# Patient Record
Sex: Female | Born: 1970 | Race: White | Hispanic: Yes | Marital: Married | Smoking: Never smoker
Health system: Southern US, Community
[De-identification: ages and names within clinical notes are randomized; demographics above are authoritative.]

## PROBLEM LIST (undated history)

## (undated) ENCOUNTER — Emergency Department (HOSPITAL_BASED_OUTPATIENT_CLINIC_OR_DEPARTMENT_OTHER): Admission: EM | Payer: Self-pay | Source: Home / Self Care

## (undated) DIAGNOSIS — I10 Essential (primary) hypertension: Secondary | ICD-10-CM

## (undated) DIAGNOSIS — F410 Panic disorder [episodic paroxysmal anxiety] without agoraphobia: Secondary | ICD-10-CM

## (undated) DIAGNOSIS — F32A Depression, unspecified: Secondary | ICD-10-CM

## (undated) DIAGNOSIS — F329 Major depressive disorder, single episode, unspecified: Secondary | ICD-10-CM

## (undated) DIAGNOSIS — E559 Vitamin D deficiency, unspecified: Secondary | ICD-10-CM

## (undated) DIAGNOSIS — G4730 Sleep apnea, unspecified: Secondary | ICD-10-CM

## (undated) HISTORY — DX: Panic disorder (episodic paroxysmal anxiety): F41.0

## (undated) HISTORY — PX: BREAST REDUCTION SURGERY: SHX8

## (undated) HISTORY — DX: Sleep apnea, unspecified: G47.30

## (undated) HISTORY — PX: ABLATION: SHX5711

## (undated) HISTORY — PX: LAPAROSCOPIC ABDOMINAL EXPLORATION: SHX6249

---

## 2002-10-27 ENCOUNTER — Emergency Department (HOSPITAL_COMMUNITY): Admission: EM | Admit: 2002-10-27 | Discharge: 2002-10-27 | Payer: Self-pay | Admitting: Emergency Medicine

## 2004-05-19 ENCOUNTER — Inpatient Hospital Stay (HOSPITAL_COMMUNITY): Admission: AD | Admit: 2004-05-19 | Discharge: 2004-05-19 | Payer: Self-pay | Admitting: Family Medicine

## 2006-03-25 ENCOUNTER — Emergency Department (HOSPITAL_COMMUNITY): Admission: EM | Admit: 2006-03-25 | Discharge: 2006-03-25 | Payer: Self-pay | Admitting: Emergency Medicine

## 2006-07-27 ENCOUNTER — Emergency Department (HOSPITAL_COMMUNITY): Admission: EM | Admit: 2006-07-27 | Discharge: 2006-07-27 | Payer: Self-pay | Admitting: Emergency Medicine

## 2006-07-30 ENCOUNTER — Emergency Department (HOSPITAL_COMMUNITY): Admission: EM | Admit: 2006-07-30 | Discharge: 2006-07-30 | Payer: Self-pay | Admitting: Emergency Medicine

## 2006-10-27 HISTORY — PX: ENDOMETRIAL ABLATION: SHX621

## 2006-12-16 ENCOUNTER — Emergency Department (HOSPITAL_COMMUNITY): Admission: EM | Admit: 2006-12-16 | Discharge: 2006-12-16 | Payer: Self-pay | Admitting: Emergency Medicine

## 2010-10-28 ENCOUNTER — Emergency Department (HOSPITAL_COMMUNITY)
Admission: EM | Admit: 2010-10-28 | Discharge: 2010-10-28 | Payer: Self-pay | Source: Home / Self Care | Admitting: Emergency Medicine

## 2011-01-06 LAB — DIFFERENTIAL
Basophils Absolute: 0.1 10*3/uL (ref 0.0–0.1)
Basophils Relative: 0 % (ref 0–1)
Eosinophils Absolute: 0.1 10*3/uL (ref 0.0–0.7)
Eosinophils Relative: 1 % (ref 0–5)
Lymphocytes Relative: 18 % (ref 12–46)
Monocytes Absolute: 0.6 10*3/uL (ref 0.1–1.0)
Neutrophils Relative %: 75 % (ref 43–77)

## 2011-01-06 LAB — CBC
MCH: 30.4 pg (ref 26.0–34.0)
Platelets: 297 10*3/uL (ref 150–400)
RBC: 4.47 MIL/uL (ref 3.87–5.11)
RDW: 13.2 % (ref 11.5–15.5)

## 2011-01-06 LAB — POCT CARDIAC MARKERS: Myoglobin, poc: 48.2 ng/mL (ref 12–200)

## 2011-01-06 LAB — BASIC METABOLIC PANEL
CO2: 25 mEq/L (ref 19–32)
Chloride: 107 mEq/L (ref 96–112)
Creatinine, Ser: 0.76 mg/dL (ref 0.4–1.2)
GFR calc Af Amer: >60 mL/min (ref >60)

## 2011-02-04 DIAGNOSIS — G9332 Myalgic encephalomyelitis/chronic fatigue syndrome: Secondary | ICD-10-CM

## 2011-02-04 DIAGNOSIS — R002 Palpitations: Secondary | ICD-10-CM | POA: Insufficient documentation

## 2011-02-04 DIAGNOSIS — N926 Irregular menstruation, unspecified: Secondary | ICD-10-CM | POA: Insufficient documentation

## 2011-02-04 DIAGNOSIS — H8110 Benign paroxysmal vertigo, unspecified ear: Secondary | ICD-10-CM | POA: Insufficient documentation

## 2011-02-04 DIAGNOSIS — D497 Neoplasm of unspecified behavior of endocrine glands and other parts of nervous system: Secondary | ICD-10-CM | POA: Insufficient documentation

## 2011-02-04 DIAGNOSIS — J309 Allergic rhinitis, unspecified: Secondary | ICD-10-CM | POA: Insufficient documentation

## 2011-02-04 DIAGNOSIS — G4700 Insomnia, unspecified: Secondary | ICD-10-CM

## 2011-02-04 DIAGNOSIS — R930 Abnormal findings on diagnostic imaging of skull and head, not elsewhere classified: Secondary | ICD-10-CM | POA: Insufficient documentation

## 2011-02-04 DIAGNOSIS — M542 Cervicalgia: Secondary | ICD-10-CM | POA: Insufficient documentation

## 2011-02-04 DIAGNOSIS — F410 Panic disorder [episodic paroxysmal anxiety] without agoraphobia: Secondary | ICD-10-CM | POA: Insufficient documentation

## 2011-02-04 DIAGNOSIS — M6790 Unspecified disorder of synovium and tendon, unspecified site: Secondary | ICD-10-CM

## 2011-02-04 DIAGNOSIS — E669 Obesity, unspecified: Secondary | ICD-10-CM | POA: Insufficient documentation

## 2011-02-04 HISTORY — DX: Palpitations: R00.2

## 2011-02-04 HISTORY — DX: Myalgic encephalomyelitis/chronic fatigue syndrome: G93.32

## 2011-02-04 HISTORY — DX: Insomnia, unspecified: G47.00

## 2011-02-04 HISTORY — DX: Unspecified disorder of synovium and tendon, unspecified site: M67.90

## 2011-04-02 DIAGNOSIS — D649 Anemia, unspecified: Secondary | ICD-10-CM | POA: Insufficient documentation

## 2011-04-02 DIAGNOSIS — E782 Mixed hyperlipidemia: Secondary | ICD-10-CM | POA: Insufficient documentation

## 2011-07-07 DIAGNOSIS — M25569 Pain in unspecified knee: Secondary | ICD-10-CM | POA: Insufficient documentation

## 2011-11-13 DIAGNOSIS — F411 Generalized anxiety disorder: Secondary | ICD-10-CM

## 2011-11-13 HISTORY — DX: Generalized anxiety disorder: F41.1

## 2012-01-22 DIAGNOSIS — Z01419 Encounter for gynecological examination (general) (routine) without abnormal findings: Secondary | ICD-10-CM | POA: Insufficient documentation

## 2012-01-22 DIAGNOSIS — M79609 Pain in unspecified limb: Secondary | ICD-10-CM | POA: Insufficient documentation

## 2013-11-30 DIAGNOSIS — J329 Chronic sinusitis, unspecified: Secondary | ICD-10-CM | POA: Insufficient documentation

## 2013-12-27 DIAGNOSIS — F4329 Adjustment disorder with other symptoms: Secondary | ICD-10-CM | POA: Insufficient documentation

## 2013-12-27 DIAGNOSIS — R5383 Other fatigue: Secondary | ICD-10-CM | POA: Insufficient documentation

## 2013-12-27 DIAGNOSIS — I10 Essential (primary) hypertension: Secondary | ICD-10-CM | POA: Insufficient documentation

## 2013-12-27 DIAGNOSIS — R5381 Other malaise: Secondary | ICD-10-CM | POA: Insufficient documentation

## 2015-04-27 ENCOUNTER — Emergency Department (HOSPITAL_BASED_OUTPATIENT_CLINIC_OR_DEPARTMENT_OTHER)
Admission: EM | Admit: 2015-04-27 | Discharge: 2015-04-27 | Disposition: A | Discharge status: Home / Self Care | Attending: Emergency Medicine | Admitting: Emergency Medicine

## 2015-04-27 ENCOUNTER — Emergency Department (HOSPITAL_BASED_OUTPATIENT_CLINIC_OR_DEPARTMENT_OTHER)

## 2015-04-27 ENCOUNTER — Encounter (HOSPITAL_BASED_OUTPATIENT_CLINIC_OR_DEPARTMENT_OTHER): Payer: Self-pay | Admitting: Emergency Medicine

## 2015-04-27 DIAGNOSIS — H05041 Tenonitis of right orbit: Secondary | ICD-10-CM

## 2015-04-27 DIAGNOSIS — F329 Major depressive disorder, single episode, unspecified: Secondary | ICD-10-CM | POA: Insufficient documentation

## 2015-04-27 DIAGNOSIS — M779 Enthesopathy, unspecified: Secondary | ICD-10-CM | POA: Diagnosis not present

## 2015-04-27 DIAGNOSIS — E559 Vitamin D deficiency, unspecified: Secondary | ICD-10-CM | POA: Insufficient documentation

## 2015-04-27 DIAGNOSIS — M25571 Pain in right ankle and joints of right foot: Secondary | ICD-10-CM | POA: Diagnosis present

## 2015-04-27 DIAGNOSIS — I10 Essential (primary) hypertension: Secondary | ICD-10-CM | POA: Diagnosis not present

## 2015-04-27 DIAGNOSIS — Z79899 Other long term (current) drug therapy: Secondary | ICD-10-CM | POA: Insufficient documentation

## 2015-04-27 HISTORY — DX: Depression, unspecified: F32.A

## 2015-04-27 HISTORY — DX: Vitamin D deficiency, unspecified: E55.9

## 2015-04-27 HISTORY — DX: Essential (primary) hypertension: I10

## 2015-04-27 HISTORY — DX: Major depressive disorder, single episode, unspecified: F32.9

## 2015-04-27 NOTE — Discharge Instructions (Signed)

## 2015-04-27 NOTE — ED Notes (Signed)
Pt states increased foot pain for one week. Denies injury. States she has been moving a lot of stuff but doesn't remember hurting it.

## 2015-04-27 NOTE — ED Provider Notes (Signed)
CSN: [id number]     Arrival date & time 04/27/15  1338 History   First MD Initiated Contact with Patient 04/27/15 1343     Chief Complaint  Patient presents with  . Foot Pain     (Consider location/radiation/quality/duration/timing/severity/associated sxs/prior Treatment) HPI Comments: Patient presents emergency department with chief complaint of right foot pain. She states that her right foot hurts on the top lateral aspect of the foot. She denies any known traumatic injury. She states that she has been going up and down a lot of stairs for the past week he can she has been helping her children move to and from college. She states that the pain is worsened with palpation. She reports a mild amount of swelling. She denies any recent long travel or immobilization. He tried taking ibuprofen with good relief. She states that she wanted to be evaluated because she is going out of town next week and would like some relief prior to leaving.  The history is provided by the patient. No language interpreter was used.    Past Medical History  Diagnosis Date  . Depression   . Hypertension   . Vitamin D deficiency    History reviewed. No pertinent past surgical history. No family history on file. History  Substance Use Topics  . Smoking status: Never Smoker   . Smokeless tobacco: Not on file  . Alcohol Use: Yes     Comment: socially   OB History    No data available     Review of Systems  Constitutional: Negative for fever and chills.  Respiratory: Negative for shortness of breath.   Cardiovascular: Negative for chest pain.  Gastrointestinal: Negative for nausea, vomiting, diarrhea and constipation.  Genitourinary: Negative for dysuria.  Musculoskeletal: Positive for myalgias and joint swelling.  All other systems reviewed and are negative.     Allergies  Review of patient's allergies indicates no known allergies.  Home Medications   Prior to Admission medications   Medication  Sig Start Date End Date Taking? Authorizing Provider  cholecalciferol (VITAMIN D) 1000 UNITS tablet Take 1,000 Units by mouth daily.   Yes Historical Provider, MD  escitalopram (LEXAPRO) 10 MG tablet Take 10 mg by mouth daily.   Yes Historical Provider, MD  metoprolol tartrate (LOPRESSOR) 25 MG tablet Take 25 mg by mouth 2 (two) times daily.   Yes Historical Provider, MD   BP 124/81 mmHg  Pulse 58  Temp(Src) 98.3 F (36.8 C) (Oral)  Ht 5' 8.5" (1.74 m)  Wt 255 lb (115.667 kg)  BMI 38.20 kg/m2  SpO2 98% Physical Exam  Constitutional: She is oriented to person, place, and time. She appears well-developed and well-nourished.  HENT:  Head: Normocephalic and atraumatic.  Eyes: Conjunctivae and EOM are normal.  Neck: Normal range of motion.  Cardiovascular: Normal rate and intact distal pulses.   Intact distal pulses with brisk capillary refill  Pulmonary/Chest: Effort normal.  Abdominal: She exhibits no distension.  Musculoskeletal: Normal range of motion.  Right foot tender to palpation over the lateral aspect, there is pain with inversion, and resisted dorsiflexion, no bony abnormality or deformity  Neurological: She is alert and oriented to person, place, and time.  Skin: Skin is dry.  Skin is warm and dry, and there is no erythema, cellulitis, or abscess  Psychiatric: She has a normal mood and affect. Her behavior is normal. Judgment and thought content normal.  Nursing note and vitals reviewed.   ED Course  Procedures (including critical care  time) Labs Review Labs Reviewed - No data to display  Imaging Review No results found.   EKG Interpretation None      MDM   Final diagnoses:  Tenonitis, right    Patient with right foot pain. I suspect that this is an extensor tendinitis, no bony abnormality or deformity. I suspect that injury is secondary to overuse and climbing a lot upstairs in the past week.  Will check plain films.  If negative, plan for conservative  therapy with ice and rest.  Follow-up with orthopedics if needed.    Montine Circle, PA-C 04/27/15 [place], MD 04/27/15 [phone]

## 2015-09-23 ENCOUNTER — Emergency Department (HOSPITAL_BASED_OUTPATIENT_CLINIC_OR_DEPARTMENT_OTHER)

## 2015-09-23 ENCOUNTER — Encounter (HOSPITAL_BASED_OUTPATIENT_CLINIC_OR_DEPARTMENT_OTHER): Payer: Self-pay | Admitting: *Deleted

## 2015-09-23 ENCOUNTER — Emergency Department (HOSPITAL_BASED_OUTPATIENT_CLINIC_OR_DEPARTMENT_OTHER)
Admission: EM | Admit: 2015-09-23 | Discharge: 2015-09-23 | Disposition: A | Discharge status: Home / Self Care | Attending: Emergency Medicine | Admitting: Emergency Medicine

## 2015-09-23 DIAGNOSIS — Z79899 Other long term (current) drug therapy: Secondary | ICD-10-CM | POA: Insufficient documentation

## 2015-09-23 DIAGNOSIS — F329 Major depressive disorder, single episode, unspecified: Secondary | ICD-10-CM | POA: Insufficient documentation

## 2015-09-23 DIAGNOSIS — I10 Essential (primary) hypertension: Secondary | ICD-10-CM | POA: Insufficient documentation

## 2015-09-23 DIAGNOSIS — R05 Cough: Secondary | ICD-10-CM | POA: Diagnosis present

## 2015-09-23 DIAGNOSIS — J40 Bronchitis, not specified as acute or chronic: Secondary | ICD-10-CM | POA: Insufficient documentation

## 2015-09-23 DIAGNOSIS — E559 Vitamin D deficiency, unspecified: Secondary | ICD-10-CM | POA: Insufficient documentation

## 2015-09-23 MED ORDER — HYDROCODONE-HOMATROPINE 5-1.5 MG/5ML PO SYRP: 5.0000 mL | ORAL_SOLUTION | Freq: Four times a day (QID) | ORAL | Status: DC | PRN

## 2015-09-23 MED ORDER — ALBUTEROL SULFATE HFA 108 (90 BASE) MCG/ACT IN AERS
2.0000 | INHALATION_SPRAY | RESPIRATORY_TRACT | Status: DC | PRN
  Administered 2015-09-23: 2 via RESPIRATORY_TRACT
  Filled 2015-09-23: qty 6.7

## 2015-09-23 MED ORDER — PREDNISONE 50 MG PO TABS
60.0000 mg | ORAL_TABLET | Freq: Once | ORAL | Status: AC
  Administered 2015-09-23: 60 mg via ORAL
  Filled 2015-09-23: qty 1

## 2015-09-23 MED ORDER — IPRATROPIUM-ALBUTEROL 0.5-2.5 (3) MG/3ML IN SOLN
3.0000 mL | Freq: Once | RESPIRATORY_TRACT | Status: AC
  Administered 2015-09-23: 3 mL via RESPIRATORY_TRACT
  Filled 2015-09-23: qty 3

## 2015-09-23 NOTE — Discharge Instructions (Signed)
Upper Respiratory Infection, Adult Most upper respiratory infections (URIs) are a viral infection of the air passages leading to the lungs. A URI affects the nose, throat, and upper air passages. The most common type of URI is nasopharyngitis and is typically referred to as "the common cold." URIs run their course and usually go away on their own. Most of the time, a URI does not require medical attention, but sometimes a bacterial infection in the upper airways can follow a viral infection. This is called a secondary infection. Sinus and middle ear infections are common types of secondary upper respiratory infections. Bacterial pneumonia can also complicate a URI. A URI can worsen asthma and chronic obstructive pulmonary disease (COPD). Sometimes, these complications can require emergency medical care and may be life threatening.  CAUSES Almost all URIs are caused by viruses. A virus is a type of germ and can spread from one person to another.  RISKS FACTORS You may be at risk for a URI if:   You smoke.   You have chronic heart or lung disease.  You have a weakened defense (immune) system.   You are very young or very old.   You have nasal allergies or asthma.  You work in crowded or poorly ventilated areas.  You work in health care facilities or schools. SIGNS AND SYMPTOMS  Symptoms typically develop 2-3 days after you come in contact with a cold virus. Most viral URIs last 7-10 days. However, viral URIs from the influenza virus (flu virus) can last 14-18 days and are typically more severe. Symptoms may include:   Runny or stuffy (congested) nose.   Sneezing.   Cough.   Sore throat.   Headache.   Fatigue.   Fever.   Loss of appetite.   Pain in your forehead, behind your eyes, and over your cheekbones (sinus pain).  Muscle aches.  DIAGNOSIS  Your health care provider may diagnose a URI by:  Physical exam.  Tests to check that your symptoms are not due to  another condition such as:  Strep throat.  Sinusitis.  Pneumonia.  Asthma. TREATMENT  A URI goes away on its own with time. It cannot be cured with medicines, but medicines may be prescribed or recommended to relieve symptoms. Medicines may help:  Reduce your fever.  Reduce your cough.  Relieve nasal congestion. HOME CARE INSTRUCTIONS   Take medicines only as directed by your health care provider.   Gargle warm saltwater or take cough drops to comfort your throat as directed by your health care provider.  Use a warm mist humidifier or inhale steam from a shower to increase air moisture. This may make it easier to breathe.  Drink enough fluid to keep your urine clear or pale yellow.   Eat soups and other clear broths and maintain good nutrition.   Rest as needed.   Return to work when your temperature has returned to normal or as your health care provider advises. You may need to stay home longer to avoid infecting others. You can also use a face mask and careful hand washing to prevent spread of the virus.  Increase the usage of your inhaler if you have asthma.   Do not use any tobacco products, including cigarettes, chewing tobacco, or electronic cigarettes. If you need help quitting, ask your health care provider. PREVENTION  The best way to protect yourself from getting a cold is to practice good hygiene.   Avoid oral or hand contact with people with cold   symptoms.   Wash your hands often if contact occurs.  There is no clear evidence that vitamin C, vitamin E, echinacea, or exercise reduces the chance of developing a cold. However, it is always recommended to get plenty of rest, exercise, and practice good nutrition.  SEEK MEDICAL CARE IF:   You are getting worse rather than better.   Your symptoms are not controlled by medicine.   You have chills.  You have worsening shortness of breath.  You have brown or red mucus.  You have yellow or brown nasal  discharge.  You have pain in your face, especially when you bend forward.  You have a fever.  You have swollen neck glands.  You have pain while swallowing.  You have white areas in the back of your throat. SEEK IMMEDIATE MEDICAL CARE IF:   You have severe or persistent:  Headache.  Ear pain.  Sinus pain.  Chest pain.  You have chronic lung disease and any of the following:  Wheezing.  Prolonged cough.  Coughing up blood.  A change in your usual mucus.  You have a stiff neck.  You have changes in your:  Vision.  Hearing.  Thinking.  Mood. MAKE SURE YOU:   Understand these instructions.  Will watch your condition.  Will get help right away if you are not doing well or get worse.   This information is not intended to replace advice given to you by your health care provider. Make sure you discuss any questions you have with your health care provider.   Document Released: 04/08/2001 Document Revised: 02/27/2015 Document Reviewed: 01/18/2014 Elsevier Interactive Patient Education 2016 [employer]  

## 2015-09-23 NOTE — ED Provider Notes (Signed)
CSN: [id number]     Arrival date & time 09/23/15  1811 History  By signing my name below, I, Stacy Solomon, attest that this documentation has been prepared under the direction and in the presence of Stacy Johns, MD. Electronically Signed: Erling Solomon, ED Scribe. 09/23/2015. 8:35 PM.    Chief Complaint  Patient presents with  . Cough  . Nasal Congestion    The history is provided by the patient. No language interpreter was used.    HPI Comments: Stacy Solomon is a 44 y.o. female who presents to the Emergency Department complaining of intermittent, moderate, cough onset 2 weeks. She reports associated congestion, wheezing and rhinorrhea. Pt notes she went to an UC last week and had a strep test done which tested positive; she has been taking Penicillin for the past 6 days with complete relief for the sore throat. She states she has also been taking Nyquil and Dayquil for mgmt of her cough and congestion with no significant relief. Pt endorses that her symptoms are worse at night. Pt denies any h/o lung issues or asthma. She denies any known sick contacts. Pt denies any nausea, vomiting, diarrhea, fevers or other associated symptoms.  Past Medical History  Diagnosis Date  . Depression   . Hypertension   . Vitamin D deficiency    Past Surgical History  Procedure Laterality Date  . Laparoscopic abdominal exploration    . Ablation  Uterine   No family history on file. Social History  Substance Use Topics  . Smoking status: Never Smoker   . Smokeless tobacco: Never Used  . Alcohol Use: Yes     Comment: socially   OB History    No data available     Review of Systems  Constitutional: Negative for fever, chills, diaphoresis and fatigue.  HENT: Positive for congestion. Negative for rhinorrhea and sneezing.   Eyes: Negative.   Respiratory: Positive for cough and wheezing. Negative for chest tightness and shortness of breath.   Cardiovascular: Negative for chest  pain and leg swelling.  Gastrointestinal: Negative for nausea, vomiting, abdominal pain, diarrhea and blood in stool.  Genitourinary: Negative for frequency, hematuria, flank pain and difficulty urinating.  Musculoskeletal: Negative for back pain and arthralgias.  Skin: Negative for rash.  Neurological: Negative for dizziness, speech difficulty, weakness, numbness and headaches.      Allergies  Review of patient's allergies indicates no known allergies.  Home Medications   Prior to Admission medications   Medication Sig Start Date End Date Taking? Authorizing Provider  escitalopram (LEXAPRO) 10 MG tablet Take 10 mg by mouth daily.   Yes Historical Provider, MD  metoprolol tartrate (LOPRESSOR) 25 MG tablet Take 25 mg by mouth 2 (two) times daily.   Yes Historical Provider, MD  cholecalciferol (VITAMIN D) 1000 UNITS tablet Take 1,000 Units by mouth daily.    Historical Provider, MD  HYDROcodone-homatropine (HYCODAN) 5-1.5 MG/5ML syrup Take 5 mLs by mouth every 6 (six) hours as needed for cough. 09/23/15   Stacy Johns, MD   Triage Vitals: BP 139/91 mmHg  Pulse 69  Temp(Src) 98 F (36.7 C) (Oral)  Resp 18  Ht 5\' 9"  (1.753 m)  Wt 252 lb (114.306 kg)  BMI 37.20 kg/m2  SpO2 95%  Physical Exam  Constitutional: She is oriented to person, place, and time. She appears well-developed and well-nourished.  HENT:  Head: Normocephalic and atraumatic.  Right Ear: Tympanic membrane normal.  Left Ear: Tympanic membrane normal.  Mouth/Throat: Oropharynx is clear and  moist.  Eyes: Pupils are equal, round, and reactive to light.  Neck: Normal range of motion. Neck supple.  Cardiovascular: Normal rate, regular rhythm and normal heart sounds.   Pulmonary/Chest: Effort normal. No respiratory distress. She has wheezes (lower lobes bilaterally). She has no rales. She exhibits no tenderness.  Abdominal: Soft. Bowel sounds are normal. There is no tenderness. There is no rebound and no guarding.   Musculoskeletal: Normal range of motion. She exhibits no edema.  Lymphadenopathy:    She has no cervical adenopathy.  Neurological: She is alert and oriented to person, place, and time.  Skin: Skin is warm and dry. No rash noted.  Psychiatric: She has a normal mood and affect.    ED Course  Procedures (including critical care time) Labs Review Labs Reviewed - No data to display  Imaging Review Dg Chest 2 View  09/23/2015  CLINICAL DATA:  Cough for 2 weeks with congestion and wheezing. EXAM: CHEST  2 VIEW COMPARISON:  PA and lateral chest 10/28/2010. FINDINGS: The lungs are clear. Heart size is normal. No pneumothorax or pleural effusion. No focal bony abnormality. IMPRESSION: No acute disease. Electronically Signed   By: Stacy Solomon M.D.   On: 09/23/2015 20:26   I have personally reviewed and evaluated these images and lab results as part of my medical decision-making.   EKG Interpretation None      MDM   Final diagnoses:  Bronchitis    Patient symptoms are consistent with bronchitis. She has no evidence of pneumonia. She was discharged home in good condition. She was given nebulizer treatment in the ED and is feeling better after this. Her oxygen saturations are normal. She has no other symptoms suggestive of pulmonary embolus. She was given a prescription for Hycodan cough syrup as well as dispensed an albuterol inhaler. She has a follow-up appointment tomorrow with her PCP. Return precautions were given.  I personally performed the services described in this documentation, which was scribed in my presence.  The recorded information has been reviewed and considered.     Stacy Johns, MD 09/23/15 2036

## 2015-09-23 NOTE — ED Notes (Signed)
Pt reports sick x 2 weeks and has completed a course of PCN for strep throat. C/o continued cough and congestion

## 2015-09-24 DIAGNOSIS — E559 Vitamin D deficiency, unspecified: Secondary | ICD-10-CM | POA: Insufficient documentation

## 2016-01-28 DIAGNOSIS — L918 Other hypertrophic disorders of the skin: Secondary | ICD-10-CM | POA: Insufficient documentation

## 2016-01-28 DIAGNOSIS — D72829 Elevated white blood cell count, unspecified: Secondary | ICD-10-CM | POA: Insufficient documentation

## 2016-01-28 DIAGNOSIS — E65 Localized adiposity: Secondary | ICD-10-CM | POA: Insufficient documentation

## 2016-01-28 HISTORY — DX: Elevated white blood cell count, unspecified: D72.829

## 2017-05-12 LAB — HM PAP SMEAR: HM Pap smear: NEGATIVE

## 2017-05-12 LAB — RESULTS CONSOLE HPV: CHL HPV: NEGATIVE

## 2017-06-01 ENCOUNTER — Encounter (HOSPITAL_COMMUNITY): Payer: Self-pay | Admitting: Emergency Medicine

## 2017-06-01 ENCOUNTER — Emergency Department (HOSPITAL_COMMUNITY)

## 2017-06-01 ENCOUNTER — Emergency Department (HOSPITAL_COMMUNITY)
Admission: EM | Admit: 2017-06-01 | Discharge: 2017-06-01 | Disposition: A | Discharge status: Home / Self Care | Attending: Emergency Medicine | Admitting: Emergency Medicine

## 2017-06-01 DIAGNOSIS — I10 Essential (primary) hypertension: Secondary | ICD-10-CM | POA: Insufficient documentation

## 2017-06-01 DIAGNOSIS — Z79899 Other long term (current) drug therapy: Secondary | ICD-10-CM | POA: Insufficient documentation

## 2017-06-01 DIAGNOSIS — R0789 Other chest pain: Secondary | ICD-10-CM

## 2017-06-01 DIAGNOSIS — M546 Pain in thoracic spine: Secondary | ICD-10-CM | POA: Insufficient documentation

## 2017-06-01 LAB — BASIC METABOLIC PANEL
ANION GAP: 7 (ref 5–15)
BUN: 17 mg/dL (ref 6–20)
CO2: 28 mmol/L (ref 22–32)
Calcium: 9.2 mg/dL (ref 8.9–10.3)
Chloride: 105 mmol/L (ref 101–111)
Creatinine, Ser: 0.67 mg/dL (ref 0.44–1.00)
GFR calc Af Amer: >60 mL/min (ref >60)
GLUCOSE: 106 mg/dL — AB (ref 65–99)
POTASSIUM: 3.9 mmol/L (ref 3.5–5.1)
Sodium: 140 mmol/L (ref 135–145)

## 2017-06-01 LAB — I-STAT TROPONIN, ED: TROPONIN I, POC: 0 ng/mL (ref 0.00–0.08)

## 2017-06-01 LAB — LIPASE, BLOOD: LIPASE: 35 U/L (ref 11–51)

## 2017-06-01 LAB — CBC
HEMATOCRIT: 40 % (ref 36.0–46.0)
HEMOGLOBIN: 13.4 g/dL (ref 12.0–15.0)
MCH: 29.5 pg (ref 26.0–34.0)
MCHC: 33.5 g/dL (ref 30.0–36.0)
MCV: 88.1 fL (ref 78.0–100.0)
PLATELETS: 303 10*3/uL (ref 150–400)
RBC: 4.54 MIL/uL (ref 3.87–5.11)
RDW: 13.5 % (ref 11.5–15.5)
WBC: 10 10*3/uL (ref 4.0–10.5)

## 2017-06-01 LAB — HEPATIC FUNCTION PANEL
ALBUMIN: 3.7 g/dL (ref 3.5–5.0)
ALT: 23 U/L (ref 14–54)
AST: 19 U/L (ref 15–41)
Alkaline Phosphatase: 68 U/L (ref 38–126)
Bilirubin, Direct: <0.1 mg/dL — ABNORMAL LOW (ref 0.1–0.5)
TOTAL PROTEIN: 7.4 g/dL (ref 6.5–8.1)
Total Bilirubin: 0.3 mg/dL (ref 0.3–1.2)

## 2017-06-01 MED ORDER — VALACYCLOVIR HCL 1 G PO TABS: 1000.0000 mg | ORAL_TABLET | Freq: Three times a day (TID) | ORAL | 0 refills | Status: AC

## 2017-06-01 MED ORDER — KETOROLAC TROMETHAMINE 30 MG/ML IJ SOLN
30.0000 mg | Freq: Once | INTRAMUSCULAR | Status: AC
  Administered 2017-06-01: 30 mg via INTRAMUSCULAR
  Filled 2017-06-01: qty 1

## 2017-06-01 MED ORDER — OXYCODONE-ACETAMINOPHEN 5-325 MG PO TABS
1.0000 | ORAL_TABLET | Freq: Once | ORAL | Status: AC
  Administered 2017-06-01: 1 via ORAL
  Filled 2017-06-01: qty 1

## 2017-06-01 MED ORDER — OXYCODONE-ACETAMINOPHEN 5-325 MG PO TABS: 1.0000 | ORAL_TABLET | Freq: Four times a day (QID) | ORAL | 0 refills | Status: DC | PRN

## 2017-06-01 MED ORDER — IBUPROFEN 600 MG PO TABS: 600.0000 mg | ORAL_TABLET | Freq: Four times a day (QID) | ORAL | 0 refills | Status: DC | PRN

## 2017-06-01 NOTE — ED Provider Notes (Signed)
[facility] DEPT Provider Note   CSN: [id number] Arrival date & time: 06/01/17  0410     History   Chief Complaint Chief Complaint  Patient presents with  . Chest Pain  . Back Pain    HPI Stacy Solomon is a 46 y.o. female.  HPI  This is a 46 year old female with a history of hypertension who presents with chest and back pain. Onset of symptoms on Thursday. She reports progressive worsening right-sided chest and back pain. She has taken ibuprofen with minimal relief. Pain is exacerbated with movement and touch. She denies any shortness of breath. No history of blood clots, leg swelling, recent hospitalization, estrogen use. She denies any rash. She did have chickenpox as a child. Patient denies any fevers or cough. She denies infectious symptoms. Current pain is 9 out of 10. She reports potential association with food. No nausea or vomiting or diarrhea. She also reports that it gets better when she sits forward.  Past Medical History:  Diagnosis Date  . Depression   . Hypertension   . Vitamin D deficiency     There are no active problems to display for this patient.   Past Surgical History:  Procedure Laterality Date  . ABLATION  Uterine  . LAPAROSCOPIC ABDOMINAL EXPLORATION      OB History    No data available       Home Medications    Prior to Admission medications   Medication Sig Start Date End Date Taking? Authorizing Provider  Cholecalciferol (OPTIMAL D3 M PO) Take 1 capsule by mouth daily.   Yes [provider]  COENZYME Q10-OMEGA 3 FATTY ACD PO Take 1 capsule by mouth daily.   Yes [provider]  Docosahexaenoic Acid (OCEAN BLUE OPTIMAL HEALTH PO) Take 1 capsule by mouth daily.   Yes [provider]  escitalopram (LEXAPRO) 10 MG tablet Take 10 mg by mouth See admin instructions. Pt takes every four days.  She is tapering off this medication.   Yes [provider]  Magnesium-Calcium-Folic Acid 262-035-5 MG  TABS Take 1 tablet by mouth at bedtime.   Yes [provider]  OVER THE COUNTER MEDICATION Take 1 tablet by mouth daily. Vinali   Yes [provider]  OVER THE COUNTER MEDICATION Take 1 tablet by mouth daily. BioPro Q   Yes [provider]  ibuprofen (ADVIL,MOTRIN) 600 MG tablet Take 1 tablet (600 mg total) by mouth every 6 (six) hours as needed. 06/01/17   Kollin Udell, Barbette Hair, MD  oxyCODONE-acetaminophen (PERCOCET/ROXICET) 5-325 MG tablet Take 1 tablet by mouth every 6 (six) hours as needed for severe pain. 06/01/17   Monette Omara, Barbette Hair, MD  valACYclovir (VALTREX) 1000 MG tablet Take 1 tablet (1,000 mg total) by mouth 3 (three) times daily. 06/01/17 06/15/17  Merryl Hacker, MD    Family History History reviewed. No pertinent family history.  Social History Social History  Substance Use Topics  . Smoking status: Never Smoker  . Smokeless tobacco: Never Used  . Alcohol use Yes     Comment: socially     Allergies   Patient has no known allergies.   Review of Systems Review of Systems  Constitutional: Negative for fever.  Respiratory: Negative for cough and shortness of breath.   Cardiovascular: Positive for chest pain. Negative for leg swelling.  Gastrointestinal: Negative for abdominal pain, diarrhea, nausea and vomiting.  Genitourinary: Positive for flank pain. Negative for dysuria.  Musculoskeletal: Positive for back pain.  Skin: Negative for  rash.  All other systems reviewed and are negative.    Physical Exam Updated Vital Signs BP (!) 134/93 (BP Location: Left Arm)   Pulse 64   Temp (!) 97.5 F (36.4 C) (Oral)   Resp 16   Ht 5\' 6"  (1.676 m)   Wt 104.3 kg (230 lb)   SpO2 98%   BMI 37.12 kg/m   Physical Exam  Constitutional: She is oriented to person, place, and time. She appears well-developed and well-nourished.  Overweight  HENT:  Head: Normocephalic and atraumatic.  Eyes: Pupils are equal, round, and reactive to light.  Neck: Neck  supple.  Cardiovascular: Normal rate, regular rhythm and normal heart sounds.   Pulmonary/Chest: Effort normal and breath sounds normal. No respiratory distress. She has no wheezes. She exhibits tenderness.  Tenderness to palpation along the posterior and anterior chest wall at approximately the T4 distribution along the right side, no crepitus, no deformities, no overlying skin changes  Abdominal: Soft. Bowel sounds are normal. There is no tenderness.  Musculoskeletal: She exhibits no edema.  Neurological: She is alert and oriented to person, place, and time.  Skin: Skin is warm and dry. No rash noted.  Psychiatric: She has a normal mood and affect.  Nursing note and vitals reviewed.    ED Treatments / Results  Labs (all labs ordered are listed, but only abnormal results are displayed) Labs Reviewed  BASIC METABOLIC PANEL - Abnormal; Notable for the following:       Result Value   Glucose, Bld 106 (*)    All other components within normal limits  HEPATIC FUNCTION PANEL - Abnormal; Notable for the following:    Bilirubin, Direct <0.1 (*)    All other components within normal limits  CBC  LIPASE, BLOOD  I-STAT TROPONIN, ED    EKG  EKG Interpretation  Date/Time:  Monday June 01 2017 04:19:56 EDT Ventricular Rate:  55 PR Interval:    QRS Duration: 96 QT Interval:  452 QTC Calculation: 433 R Axis:   66 Text Interpretation:  Sinus rhythm Abnormal R-wave progression, early transition Confirmed by Thayer Jew [phone]) on 06/01/2017 4:22:21 AM       Radiology Dg Chest 2 View  Result Date: 06/01/2017 CLINICAL DATA:  Acute onset of generalized chest pain. Initial encounter. EXAM: CHEST  2 VIEW COMPARISON:  Chest radiograph performed 09/23/2015 FINDINGS: The lungs are well-aerated and clear. There is no evidence of focal opacification, pleural effusion or pneumothorax. The heart is normal in size; the mediastinal contour is within normal limits. No acute osseous abnormalities  are seen. IMPRESSION: No acute cardiopulmonary process seen. Electronically Signed   By: Garald Balding M.D.   On: 06/01/2017 04:52    Procedures Procedures (including critical care time)  Medications Ordered in ED Medications  oxyCODONE-acetaminophen (PERCOCET/ROXICET) 5-325 MG per tablet 1 tablet (1 tablet Oral Given 06/01/17 0457)  ketorolac (TORADOL) 30 MG/ML injection 30 mg (30 mg Intramuscular Given 06/01/17 0540)     Initial Impression / Assessment and Plan / ED Course  I have reviewed the triage vital signs and the nursing notes.  Pertinent labs & imaging results that were available during my care of the patient were reviewed by me and considered in my medical decision making (see chart for details).     Patient presents with chest wall pain and back pain. Ongoing left 4 days. It is reproducible on exam along a dermatomal pattern. No overlying skin changes or rash.  She is fairly low-risk ACS. EKG  is nonischemic. Troponin is negative. This effectively: Without ACS given duration of symptoms. She is PERC negative with reproducible pain on exam. Doubt PE. While she has some relief with sitting forward, given reproducible nature and no evidence of EKG abnormalities, doubt pericarditis. Patient was given pain medication to include Toradol and Percocet. Chest x-ray shows no evidence of pneumonia or pneumothorax. High suspicion for prodrome to shingles versus musculoskeletal etiology. Given the patient had potentially some relation with eating, LFTs and hepatic function tests were obtained and negative. She has no tenderness over gallbladder. I discussed possible diagnoses with patient. Recommend supportive measures at home. She is also given Valtrex if she were to develop a rash. Patient reassured. She was given strict return precautions.  After history, exam, and medical workup I feel the patient has been appropriately medically screened and is safe for discharge home. Pertinent diagnoses were  discussed with the patient. Patient was given return precautions.   Final Clinical Impressions(s) / ED Diagnoses   Final diagnoses:  Chest wall pain    New Prescriptions New Prescriptions   IBUPROFEN (ADVIL,MOTRIN) 600 MG TABLET    Take 1 tablet (600 mg total) by mouth every 6 (six) hours as needed.   OXYCODONE-ACETAMINOPHEN (PERCOCET/ROXICET) 5-325 MG TABLET    Take 1 tablet by mouth every 6 (six) hours as needed for severe pain.   VALACYCLOVIR (VALTREX) 1000 MG TABLET    Take 1 tablet (1,000 mg total) by mouth 3 (three) times daily.     Merryl Hacker, MD 06/01/17 [phone]

## 2017-06-01 NOTE — ED Triage Notes (Signed)
Pt reports having right sided back and chest pain that has been ongoing since 05/27/17. Pt reports pain is worse when taking a deep breath or movement.

## 2017-06-01 NOTE — ED Notes (Signed)
Returned from xray

## 2017-06-01 NOTE — ED Notes (Signed)
ED Provider at bedside. 

## 2017-06-01 NOTE — Discharge Instructions (Signed)
You were seen today for chest and back pain. Your workup is reassuring including EKG, heart testing, abdominal labs. Given the location of your pain and tenderness upon palpation, I have a high suspicion this may be a prodrome to shingles. Musculoskeletal pain is another possibility. If you develop a rash, get your Valtrex prescription filled. You will be given a short course of pain medication along with continued anti-inflammatories.  If you develop any new or worsening symptoms she should be reevaluated.

## 2018-04-22 ENCOUNTER — Emergency Department (HOSPITAL_COMMUNITY)
Admission: EM | Admit: 2018-04-22 | Discharge: 2018-04-22 | Disposition: A | Discharge status: Home / Self Care | Attending: Emergency Medicine | Admitting: Emergency Medicine

## 2018-04-22 ENCOUNTER — Encounter (HOSPITAL_COMMUNITY): Payer: Self-pay | Admitting: Emergency Medicine

## 2018-04-22 ENCOUNTER — Emergency Department (HOSPITAL_COMMUNITY)

## 2018-04-22 DIAGNOSIS — T782XXA Anaphylactic shock, unspecified, initial encounter: Secondary | ICD-10-CM | POA: Diagnosis present

## 2018-04-22 DIAGNOSIS — J45909 Unspecified asthma, uncomplicated: Secondary | ICD-10-CM | POA: Diagnosis not present

## 2018-04-22 DIAGNOSIS — R0789 Other chest pain: Secondary | ICD-10-CM

## 2018-04-22 DIAGNOSIS — Z79899 Other long term (current) drug therapy: Secondary | ICD-10-CM | POA: Diagnosis not present

## 2018-04-22 LAB — BASIC METABOLIC PANEL
ANION GAP: 7 (ref 5–15)
BUN: 15 mg/dL (ref 6–20)
CALCIUM: 9 mg/dL (ref 8.9–10.3)
CO2: 27 mmol/L (ref 22–32)
Chloride: 107 mmol/L (ref 98–111)
Creatinine, Ser: 0.74 mg/dL (ref 0.44–1.00)
Glucose, Bld: 107 mg/dL — ABNORMAL HIGH (ref 70–99)
Potassium: 4.1 mmol/L (ref 3.5–5.1)
SODIUM: 141 mmol/L (ref 135–145)

## 2018-04-22 LAB — CBC
HCT: 42.1 % (ref 36.0–46.0)
HEMOGLOBIN: 13.9 g/dL (ref 12.0–15.0)
MCH: 29.8 pg (ref 26.0–34.0)
MCHC: 33 g/dL (ref 30.0–36.0)
MCV: 90.1 fL (ref 78.0–100.0)
Platelets: 304 10*3/uL (ref 150–400)
RBC: 4.67 MIL/uL (ref 3.87–5.11)
RDW: 13.3 % (ref 11.5–15.5)
WBC: 9.1 10*3/uL (ref 4.0–10.5)

## 2018-04-22 LAB — I-STAT BETA HCG BLOOD, ED (MC, WL, AP ONLY): I-stat hCG, quantitative: <5 m[IU]/mL (ref <5)

## 2018-04-22 LAB — I-STAT TROPONIN, ED
TROPONIN I, POC: 0 ng/mL (ref 0.00–0.08)
Troponin i, poc: 0 ng/mL (ref 0.00–0.08)

## 2018-04-22 LAB — TROPONIN I

## 2018-04-22 MED ORDER — ASPIRIN 325 MG PO TABS
325.0000 mg | ORAL_TABLET | Freq: Once | ORAL | Status: AC
  Administered 2018-04-22: 325 mg via ORAL
  Filled 2018-04-22: qty 1

## 2018-04-22 MED ORDER — SODIUM CHLORIDE 0.9 % IV BOLUS
1000.0000 mL | Freq: Once | INTRAVENOUS | Status: AC
  Administered 2018-04-22: 1000 mL via INTRAVENOUS

## 2018-04-22 MED ORDER — ACETAMINOPHEN 325 MG PO TABS
650.0000 mg | ORAL_TABLET | Freq: Once | ORAL | Status: AC
  Administered 2018-04-22: 650 mg via ORAL
  Filled 2018-04-22: qty 2

## 2018-04-22 NOTE — ED Triage Notes (Signed)
Pt reports that she was at work when started having chest pains with bilat arm tingling.

## 2018-04-22 NOTE — Discharge Instructions (Addendum)
Take ASA 81 mg daily.   See your doctor. Schedule for stress test if you still have pain   Return to ER if you have worse chest pain, trouble breathing

## 2018-04-22 NOTE — ED Provider Notes (Signed)
[facility] DEPT Provider Note   CSN: [id number] Arrival date & time: 04/22/18  1221     History   Chief Complaint Chief Complaint  Patient presents with  . Chest Pain    HPI Stacy Solomon is a 47 y.o. female hx of HTN, depression, here with chest pain. Patient has been under a lot of stress. 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 She states that it is worse with she tried to walk downstairs. Patient called her husband, who called EMS. Patient states that she has no personal hx of CAD but multiple family members has MI age 75s. Patient denies any recent travel or leg swelling or leg pain   The history is provided by the patient.    Past Medical History:  Diagnosis Date  . Depression   . Hypertension   . Vitamin D deficiency     There are no active problems to display for this patient.   Past Surgical History:  Procedure Laterality Date  . ABLATION  Uterine  . LAPAROSCOPIC ABDOMINAL EXPLORATION       OB History   None      Home Medications    Prior to Admission medications   Medication Sig Start Date End Date Taking? Authorizing Provider  Cholecalciferol (OPTIMAL D3 M PO) Take 1 capsule by mouth daily.   Yes [provider]  COENZYME Q10-OMEGA 3 FATTY ACD PO Take 1 capsule by mouth daily.   Yes [provider]  Docosahexaenoic Acid (OCEAN BLUE OPTIMAL HEALTH PO) Take 1 capsule by mouth daily.   Yes [provider]  Magnesium-Calcium-Folic Acid 314-970-2 MG TABS Take 1 tablet by mouth at bedtime.   Yes [provider]  OVER THE COUNTER MEDICATION Take 1 tablet by mouth daily. Vinali   Yes [provider]  OVER THE COUNTER MEDICATION Take 1 tablet by mouth daily. BioPro Q   Yes [provider]  ibuprofen (ADVIL,MOTRIN) 600 MG tablet Take 1 tablet (600 mg total) by mouth every 6 (six) hours as  needed. Patient not taking: Reported on 04/22/2018 06/01/17   Horton, Barbette Hair, MD  oxyCODONE-acetaminophen (PERCOCET/ROXICET) 5-325 MG tablet Take 1 tablet by mouth every 6 (six) hours as needed for severe pain. Patient not taking: Reported on 04/22/2018 06/01/17   Horton, Barbette Hair, MD    Family History No family history on file.  Social History Social History   Tobacco Use  . Smoking status: Never Smoker  . Smokeless tobacco: Never Used  Substance Use Topics  . Alcohol use: Yes    Comment: socially  . Drug use: No     Allergies   Patient has no known allergies.   Review of Systems Review of Systems  Cardiovascular: Positive for chest pain.  All other systems reviewed and are negative.    Physical Exam Updated Vital Signs BP 136/76   Pulse 66   Temp 97.7 F (36.5 C) (Oral)   Resp 15   Wt 104.3 kg (230 lb)   SpO2 97%   BMI 37.12 kg/m   Physical Exam  Constitutional: She appears well-developed.  HENT:  Head: Normocephalic.  Eyes: Pupils are equal, round, and reactive to light.  Neck: Normal range of motion.  Cardiovascular: Normal rate, regular rhythm and normal pulses.  Pulmonary/Chest: Effort normal and breath sounds normal.  Abdominal: Soft. Bowel sounds are normal.  Musculoskeletal: Normal range of  motion.       Right lower leg: Normal. She exhibits no tenderness and no edema.       Left lower leg: Normal. She exhibits no tenderness and no edema.  Neurological: She is alert.  Skin: Skin is warm. Capillary refill takes less than 2 seconds.  Psychiatric: She has a normal mood and affect. Her behavior is normal.  Nursing note and vitals reviewed.    ED Treatments / Results  Labs (all labs ordered are listed, but only abnormal results are displayed) Labs Reviewed  BASIC METABOLIC PANEL - Abnormal; Notable for the following components:      Result Value   Glucose, Bld 107 (*)    All other components within normal limits  CBC  TROPONIN I  I-STAT  TROPONIN, ED  I-STAT BETA HCG BLOOD, ED ([facility], [facility], [facility] ONLY)  I-STAT TROPONIN, ED    EKG EKG Interpretation  Date/Time:  Thursday April 22 2018 12:27:56 EDT Ventricular Rate:  74 PR Interval:    QRS Duration: 90 QT Interval:  388 QTC Calculation: 431 R Axis:   65 Text Interpretation:  Sinus rhythm No significant change since last tracing Confirmed by Wandra Arthurs [phone]) on 04/22/2018 1:04:17 PM Also confirmed by Wandra Arthurs [phone]), editor Lynder Parents [phone])  on 04/22/2018 2:09:33 PM   Radiology Dg Chest 2 View  Result Date: 04/22/2018 CLINICAL DATA:  New onset mid chest pain with tingling in bilat arms this a.m, nonsmoker, no other chest complaints EXAM: CHEST - 2 VIEW COMPARISON:  06/01/2017 FINDINGS: Cardiac silhouette is normal in size and configuration. Normal mediastinal and hilar contours. Clear lungs.  No pleural effusion or pneumothorax. Skeletal structures are unremarkable. IMPRESSION: No active cardiopulmonary disease. Electronically Signed   By: Lajean Manes M.D.   On: 04/22/2018 13:05    Procedures Procedures (including critical care time)  Medications Ordered in ED Medications  sodium chloride 0.9 % bolus 1,000 mL (0 mLs Intravenous Stopped 04/22/18 1425)  aspirin tablet 325 mg (325 mg Oral Given 04/22/18 1325)  acetaminophen (TYLENOL) tablet 650 mg (650 mg Oral Given 04/22/18 1325)     Initial Impression / Assessment and Plan / ED Course  I have reviewed the triage vital signs and the nursing notes.  Pertinent labs & imaging results that were available during my care of the patient were reviewed by me and considered in my medical decision making (see chart for details).     Stacy Solomon is a 47 y.o. female here with chest pain. Chest pain earlier today with exertion. Pain resolved currently. Has no personal hx of CAD but has family history. I doubt PE or dissection. Will get labs, CXR, trop x 2.   3:31 PM Trop neg x 2. CXR normal. No ACS  currently but she can have underlying heart disease. Recommend stress test with PCP if she has persistent symptoms. Recommend ASA 81 mg daily.   Final Clinical Impressions(s) / ED Diagnoses   Final diagnoses:  Other chest pain    ED Discharge Orders    None       Drenda Freeze, MD 04/22/18 1534

## 2019-01-22 IMAGING — CR DG CHEST 2V
2 series · 2 of 2 positions shown · non-contrast
Comparison: 06/01/2017

CLINICAL DATA: New onset mid chest pain with tingling in bilat arms
this a.m, nonsmoker, no other chest complaints

EXAM:
CHEST - 2 VIEW

[w chest pa]
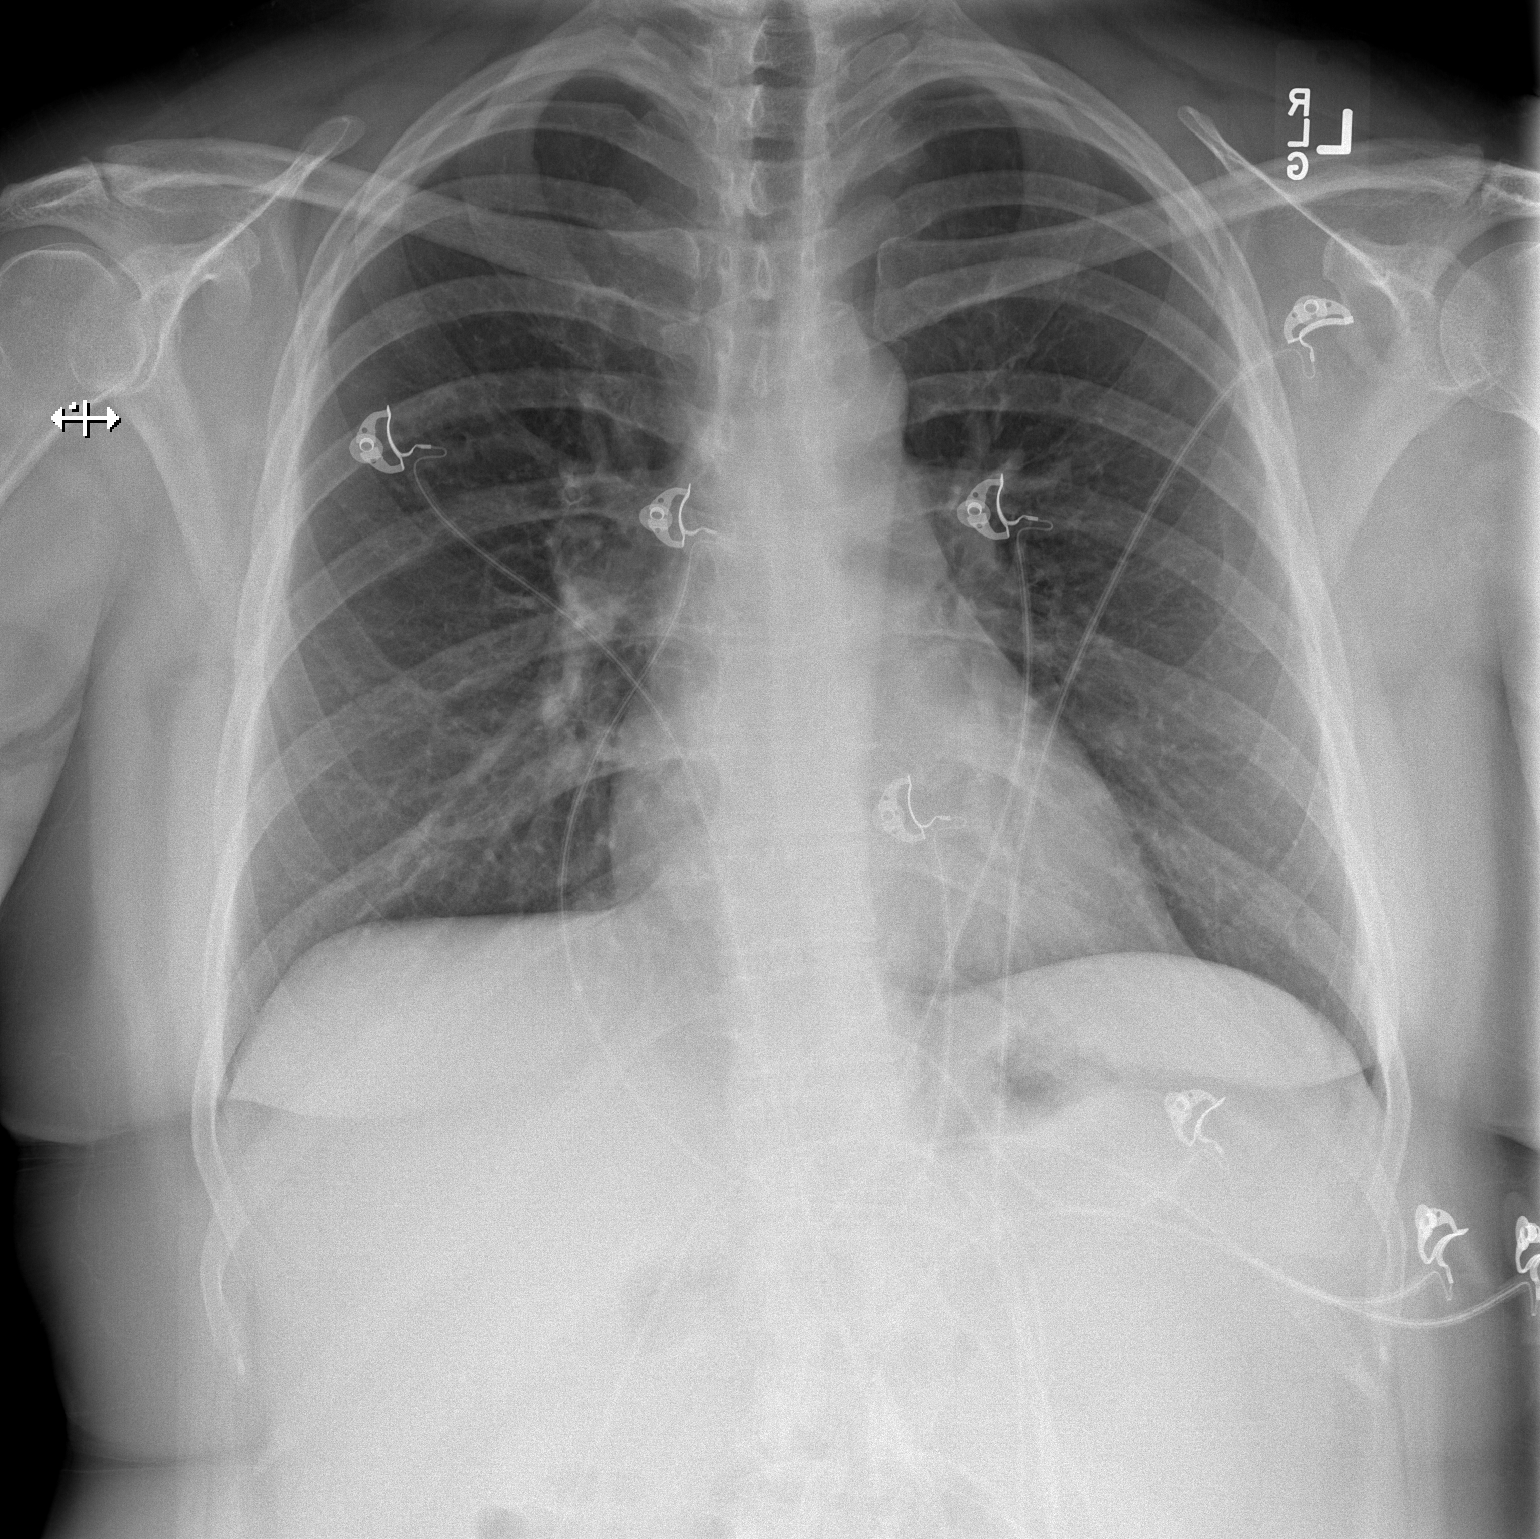

[w chest lat]
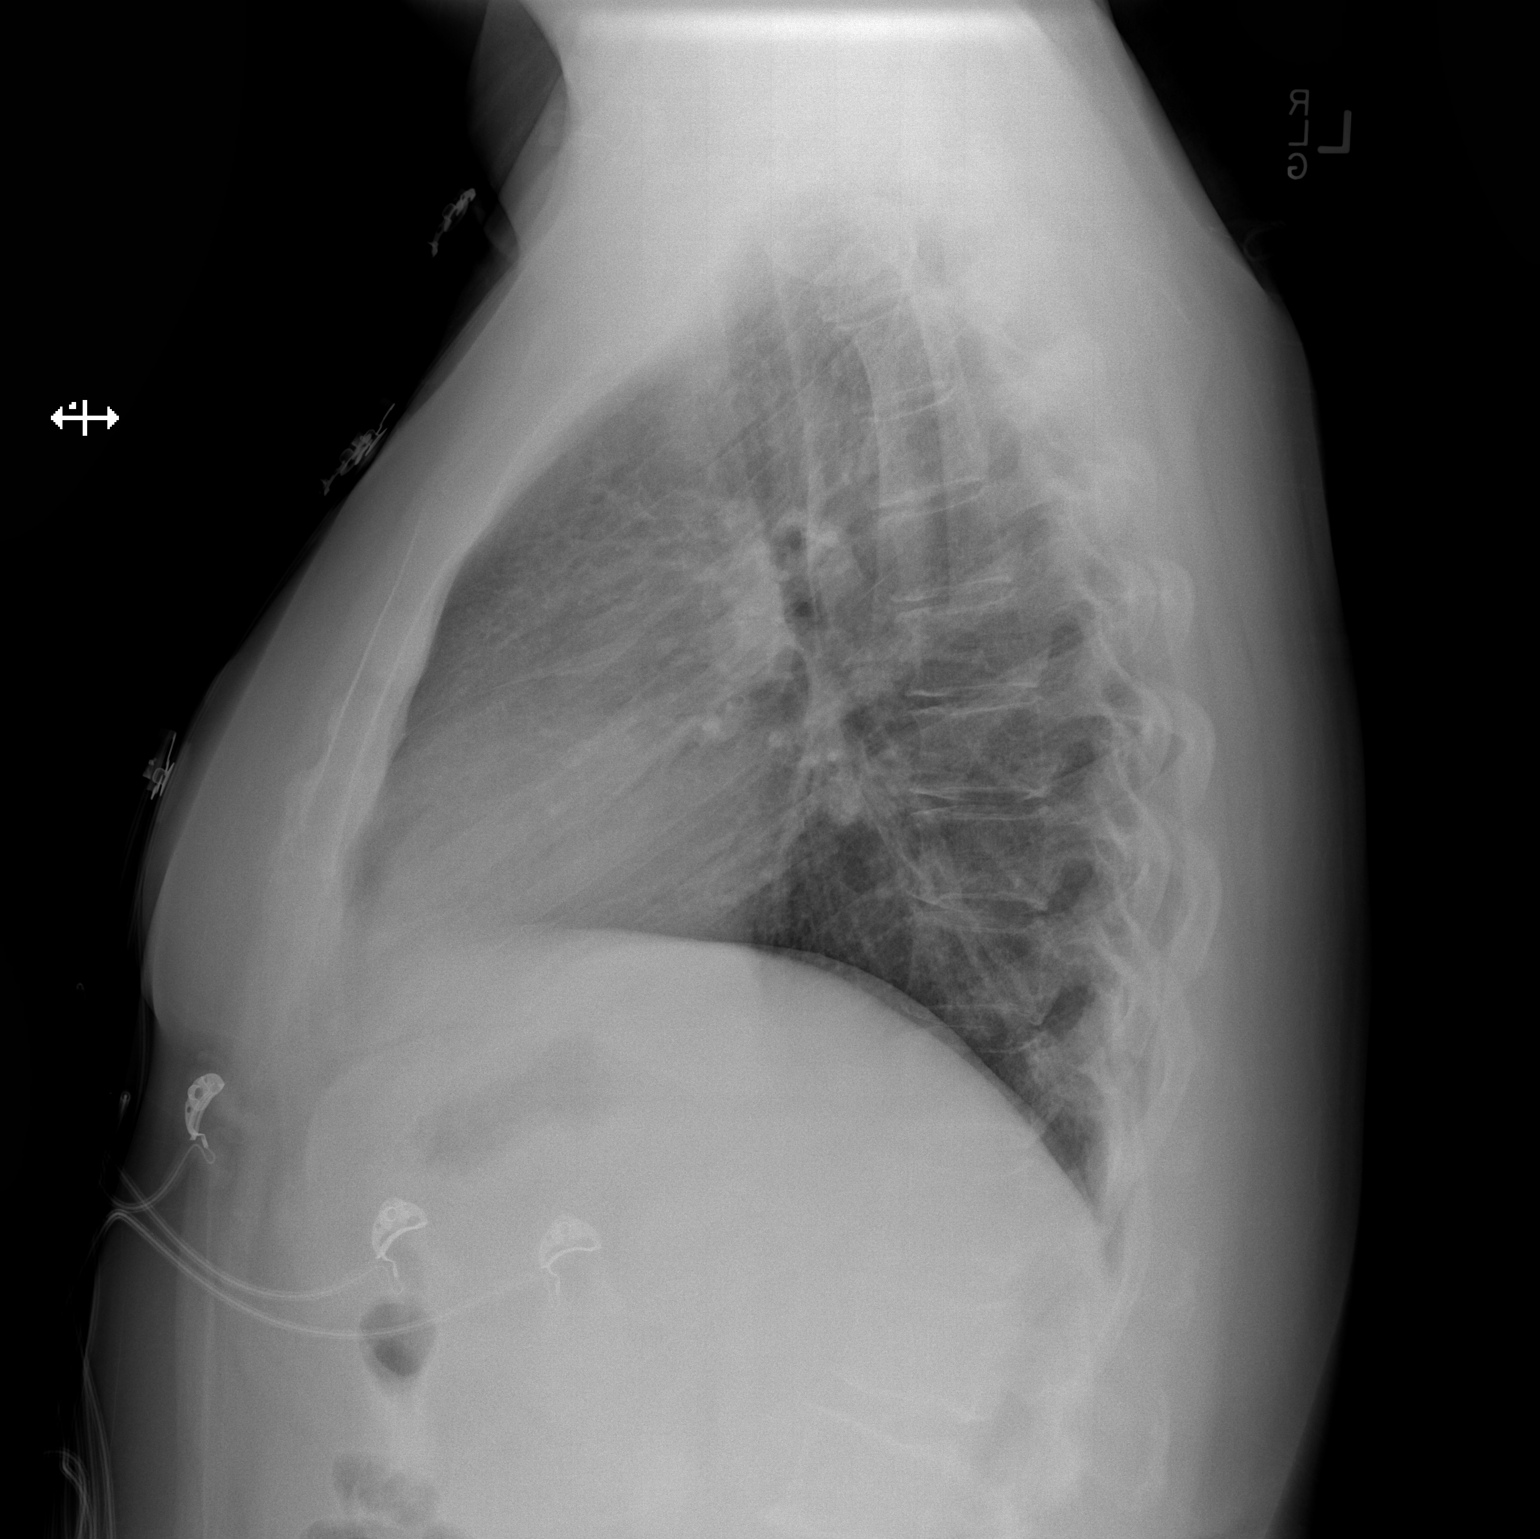

[2 of 2 positions shown; findings below may reference images not displayed]

FINDINGS: Cardiac silhouette is normal in size and configuration. Normal
mediastinal and hilar contours.

Clear lungs.  No pleural effusion or pneumothorax.

Skeletal structures are unremarkable.
IMPRESSION: No active cardiopulmonary disease.

## 2021-07-10 LAB — HM MAMMOGRAPHY

## 2021-08-13 ENCOUNTER — Ambulatory Visit (INDEPENDENT_AMBULATORY_CARE_PROVIDER_SITE_OTHER): Admitting: Family Medicine

## 2021-08-13 ENCOUNTER — Encounter: Payer: Self-pay | Admitting: Family Medicine

## 2021-08-13 ENCOUNTER — Other Ambulatory Visit: Payer: Self-pay

## 2021-08-13 VITALS — BP 113/76 | HR 62 | Temp 97.8°F | Ht 67.5 in | Wt 246.0 lb

## 2021-08-13 DIAGNOSIS — Z1159 Encounter for screening for other viral diseases: Secondary | ICD-10-CM

## 2021-08-13 DIAGNOSIS — E6609 Other obesity due to excess calories: Secondary | ICD-10-CM

## 2021-08-13 DIAGNOSIS — Z0000 Encounter for general adult medical examination without abnormal findings: Secondary | ICD-10-CM | POA: Diagnosis not present

## 2021-08-13 DIAGNOSIS — L989 Disorder of the skin and subcutaneous tissue, unspecified: Secondary | ICD-10-CM

## 2021-08-13 DIAGNOSIS — Z6837 Body mass index (BMI) 37.0-37.9, adult: Secondary | ICD-10-CM

## 2021-08-13 DIAGNOSIS — E782 Mixed hyperlipidemia: Secondary | ICD-10-CM | POA: Diagnosis not present

## 2021-08-13 DIAGNOSIS — Z1211 Encounter for screening for malignant neoplasm of colon: Secondary | ICD-10-CM

## 2021-08-13 DIAGNOSIS — E559 Vitamin D deficiency, unspecified: Secondary | ICD-10-CM

## 2021-08-13 DIAGNOSIS — R7303 Prediabetes: Secondary | ICD-10-CM

## 2021-08-13 DIAGNOSIS — Z7689 Persons encountering health services in other specified circumstances: Secondary | ICD-10-CM | POA: Diagnosis not present

## 2021-08-13 LAB — CBC
HCT: 40.6 % (ref 36.0–46.0)
Hemoglobin: 13.4 g/dL (ref 12.0–15.0)
MCHC: 33 g/dL (ref 30.0–36.0)
MCV: 89.9 fl (ref 78.0–100.0)
Platelets: 304 10*3/uL (ref 150.0–400.0)
RBC: 4.52 Mil/uL (ref 3.87–5.11)
RDW: 13.8 % (ref 11.5–15.5)
WBC: 5.7 10*3/uL (ref 4.0–10.5)

## 2021-08-13 LAB — LIPID PANEL
Cholesterol: 201 mg/dL — ABNORMAL HIGH (ref 0–200)
HDL: 60.3 mg/dL (ref >39.00)
LDL Cholesterol: 127 mg/dL — ABNORMAL HIGH (ref 0–99)
NonHDL: 141.03
Total CHOL/HDL Ratio: 3
Triglycerides: 69 mg/dL (ref 0.0–149.0)
VLDL: 13.8 mg/dL (ref 0.0–40.0)

## 2021-08-13 LAB — COMPREHENSIVE METABOLIC PANEL
ALT: 15 U/L (ref 0–35)
AST: 15 U/L (ref 0–37)
Albumin: 4.2 g/dL (ref 3.5–5.2)
Alkaline Phosphatase: 83 U/L (ref 39–117)
BUN: 10 mg/dL (ref 6–23)
CO2: 28 mEq/L (ref 19–32)
Calcium: 9.2 mg/dL (ref 8.4–10.5)
Chloride: 105 mEq/L (ref 96–112)
Creatinine, Ser: 0.66 mg/dL (ref 0.40–1.20)
GFR: 101.37 mL/min (ref >60.00)
Glucose, Bld: 90 mg/dL (ref 70–99)
Potassium: 4.1 mEq/L (ref 3.5–5.1)
Sodium: 140 mEq/L (ref 135–145)
Total Bilirubin: 0.4 mg/dL (ref 0.2–1.2)
Total Protein: 6.9 g/dL (ref 6.0–8.3)

## 2021-08-13 LAB — TSH: TSH: 1.14 u[IU]/mL (ref 0.35–5.50)

## 2021-08-13 LAB — T4, FREE: Free T4: 0.83 ng/dL (ref 0.60–1.60)

## 2021-08-13 LAB — HEMOGLOBIN A1C: Hgb A1c MFr Bld: 5.7 % (ref 4.6–6.5)

## 2021-08-13 NOTE — Patient Instructions (Signed)
Great to meet you today.  I have refilled the medication(s) we provide.   If labs were collected, we will inform you of lab results once received either by echart message or telephone call.   - echart message- for normal results that have been seen by the patient already.   - telephone call: abnormal results or if patient has not viewed results in their echart.  Health Maintenance, Female Adopting a healthy lifestyle and getting preventive care are important in promoting health and wellness. Ask your health care provider about: The right schedule for you to have regular tests and exams. Things you can do on your own to prevent diseases and keep yourself healthy. What should I know about diet, weight, and exercise? Eat a healthy diet  Eat a diet that includes plenty of vegetables, fruits, low-fat dairy products, and lean protein. Do not eat a lot of foods that are high in solid fats, added sugars, or sodium. Maintain a healthy weight Body mass index (BMI) is used to identify weight problems. It estimates body fat based on height and weight. Your health care provider can help determine your BMI and help you achieve or maintain a healthy weight. Get regular exercise Get regular exercise. This is one of the most important things you can do for your health. Most adults should: Exercise for at least 150 minutes each week. The exercise should increase your heart rate and make you sweat (moderate-intensity exercise). Do strengthening exercises at least twice a week. This is in addition to the moderate-intensity exercise. Spend less time sitting. Even light physical activity can be beneficial. Watch cholesterol and blood lipids Have your blood tested for lipids and cholesterol at 51 years of age, then have this test every 5 years. Have your cholesterol levels checked more often if: Your lipid or cholesterol levels are high. You are older than 51 years of age. You are at high risk for heart  disease. What should I know about cancer screening? Depending on your health history and family history, you may need to have cancer screening at various ages. This may include screening for: Breast cancer. Cervical cancer. Colorectal cancer. Skin cancer. Lung cancer. What should I know about heart disease, diabetes, and high blood pressure? Blood pressure and heart disease High blood pressure causes heart disease and increases the risk of stroke. This is more likely to develop in people who have high blood pressure readings, are of African descent, or are overweight. Have your blood pressure checked: Every 3-5 years if you are 33-16 years of age. Every year if you are 55 years old or older. Diabetes Have regular diabetes screenings. This checks your fasting blood sugar level. Have the screening done: Once every three years after age 45 if you are at a normal weight and have a low risk for diabetes. More often and at a younger age if you are overweight or have a high risk for diabetes. What should I know about preventing infection? Hepatitis B If you have a higher risk for hepatitis B, you should be screened for this virus. Talk with your health care provider to find out if you are at risk for hepatitis B infection. Hepatitis C Testing is recommended for: Everyone born from [birth year] through 1966. Anyone with known risk factors for hepatitis C. Sexually transmitted infections (STIs) Get screened for STIs, including gonorrhea and chlamydia, if: You are sexually active and are younger than 51 years of age. You are older than 51 years of age and  your health care provider tells you that you are at risk for this type of infection. Your sexual activity has changed since you were last screened, and you are at increased risk for chlamydia or gonorrhea. Ask your health care provider if you are at risk. Ask your health care provider about whether you are at high risk for HIV. Your health care provider  may recommend a prescription medicine to help prevent HIV infection. If you choose to take medicine to prevent HIV, you should first get tested for HIV. You should then be tested every 3 months for as long as you are taking the medicine. Pregnancy If you are about to stop having your period (premenopausal) and you may become pregnant, seek counseling before you get pregnant. Take 400 to 800 micrograms (mcg) of folic acid every day if you become pregnant. Ask for birth control (contraception) if you want to prevent pregnancy. Osteoporosis and menopause Osteoporosis is a disease in which the bones lose minerals and strength with aging. This can result in bone fractures. If you are 67 years old or older, or if you are at risk for osteoporosis and fractures, ask your health care provider if you should: Be screened for bone loss. Take a calcium or vitamin D supplement to lower your risk of fractures. Be given hormone replacement therapy (HRT) to treat symptoms of menopause. Follow these instructions at home: Lifestyle Do not use any products that contain nicotine or tobacco, such as cigarettes, e-cigarettes, and chewing tobacco. If you need help quitting, ask your health care provider. Do not use street drugs. Do not share needles. Ask your health care provider for help if you need support or information about quitting drugs. Alcohol use Do not drink alcohol if: Your health care provider tells you not to drink. You are pregnant, may be pregnant, or are planning to become pregnant. If you drink alcohol: Limit how much you use to 0-1 drink a day. Limit intake if you are breastfeeding. Be aware of how much alcohol is in your drink. In the [place] one drink equals one 12 oz bottle of beer (355 mL), one 5 oz glass of wine (148 mL), or one 1 oz glass of hard liquor (44 mL). General instructions Schedule regular health, dental, and eye exams. Stay current with your vaccines. Tell your health care  provider if: You often feel depressed. You have ever been abused or do not feel safe at home. Summary Adopting a healthy lifestyle and getting preventive care are important in promoting health and wellness. Follow your health care provider's instructions about healthy diet, exercising, and getting tested or screened for diseases. Follow your health care provider's instructions on monitoring your cholesterol and blood pressure. This information is not intended to replace advice given to you by your health care provider. Make sure you discuss any questions you have with your health care provider. Document Revised: 12/21/2020 Document Reviewed: 10/06/2018 Elsevier Patient Education  2022 [employer].

## 2021-08-13 NOTE — Progress Notes (Signed)
Patient ID: Stacy Solomon, female  DOB: [birth year], 50 y.o.   MRN: [id number] Patient Care Team    Relationship Specialty Notifications Start End  Ma Hillock, DO PCP - General Family Medicine  08/13/21   Camillo Flaming, [place] Referring Physician Optometry  08/13/21     Chief Complaint  Patient presents with   Establish Care    CPE; pt is fasting   Referral    Derm    Subjective:  Stacy Solomon is a 50 y.o.  female present for new patient establishment/CPE All past medical history, surgical history, allergies, family history, immunizations, medications and social history were updated in the electronic medical record today. All recent labs, ED visits and hospitalizations within the last year were reviewed.  Health maintenance:  Colonoscopy: She reports she has never had a screen. No fhx. > referral placed.  Mammogram: FHX GM. completed:07/10/2021 ([facility]- would want completed at [facility] now that she moved) Cervical cancer screening: last pap: 05/13/2017, results: NL/- HPV, completed by: PCP Immunizations: tdap ?, Influenza declined (encouraged yearly), zostavax declined Infectious disease screening: HIV declined, Hep C agreeable to screen today DEXA:routine screen Assistive device: none Oxygen WUJ:WJXB Patient has a Dental home. Hospitalizations/ED visits:reviewed  Depression screen [facility] 2/9 08/13/2021  Decreased Interest 0  Down, Depressed, Hopeless 0  PHQ - 2 Score 0   No flowsheet data found.      No flowsheet data found.   Immunization History  Administered Date(s) Administered   [employer] Vaccination 01/16/2020, 02/12/2020   No results found.  Past Medical History:  Diagnosis Date   Anxiety state 11/13/2011   Formatting of this note might be different from the original. STORY: Will be flying to [place] and requested something to have prior to flight   Chronic fatigue syndrome 02/04/2011   Disorder of synovium, tendon,  and bursa 02/04/2011   Hypertension    Insomnia 02/04/2011   Formatting of this note might be different from the original. STORY: offered to put her on sleep medicine but she declined offer   Leukocytosis 01/28/2016   Panic attacks    Vitamin D deficiency    No Known Allergies Past Surgical History:  Procedure Laterality Date   BREAST REDUCTION SURGERY Bilateral    ENDOMETRIAL ABLATION  2008   LAPAROSCOPIC ABDOMINAL EXPLORATION     Family History  Problem Relation Age of Onset   Hypertension Mother    Hyperlipidemia Mother    Diabetes Mother    Arthritis Mother    Hypertension Maternal Grandmother    Diabetes Maternal Grandmother    Arthritis Maternal Grandmother    Diabetes Maternal Grandfather    Heart disease Maternal Grandfather    Hypertension Maternal Grandfather    Heart attack Maternal Grandfather    Breast cancer Paternal Grandmother    Social History   Social History Narrative   Marital status/children/pets: Married   Engineer, materials:      -Wears a bicycle helmet riding a bike: Yes     -smoke alarm in the home:Yes     - wears seatbelt: Yes     - Feels safe in their relationships: Yes       Allergies as of 08/13/2021   No Known Allergies      Medication List        Accurate as of August 13, 2021  5:16 PM. If you have any questions, ask your nurse or doctor.          STOP taking  these medications    amoxicillin-clavulanate 875-125 MG tablet Commonly known as: AUGMENTIN Stopped by: Howard Pouch, DO   COENZYME Q10-OMEGA 3 FATTY ACD PO Stopped by: Howard Pouch, DO   escitalopram 10 MG tablet Commonly known as: LEXAPRO Stopped by: Howard Pouch, DO   Magnesium-Calcium-Folic Acid 643-329-5 MG Tabs Stopped by: Howard Pouch, DO   OPTIMAL D3 M PO Stopped by: Howard Pouch, DO   [place] by: Howard Pouch, DO   OVER THE COUNTER MEDICATION Stopped by: Howard Pouch, DO   oxyCODONE-acetaminophen 5-325 MG tablet Commonly known  as: PERCOCET/ROXICET Stopped by: Howard Pouch, DO   predniSONE 20 MG tablet Commonly known as: DELTASONE Stopped by: Howard Pouch, DO   Semaglutide-Weight Management 0.5 MG/0.5ML Soaj Stopped by: Howard Pouch, DO   trimethoprim-polymyxin b ophthalmic solution Commonly known as: POLYTRIM Stopped by: Howard Pouch, DO       TAKE these medications    ibuprofen 600 MG tablet Commonly known as: ADVIL Take 1 tablet (600 mg total) by mouth every 6 (six) hours as needed.   OCEAN BLUE OPTIMAL HEALTH PO Take 1 capsule by mouth daily.        All past medical history, surgical history, allergies, family history, immunizations andmedications were updated in the EMR today and reviewed under the history and medication portions of their EMR.    Recent Results (from the past 2160 hour(s))  HM MAMMOGRAPHY     Status: None   Collection Time: 07/10/21 12:00 AM  Result Value Ref Range   HM Mammogram 0-4 Bi-Rad 0-4 Bi-Rad, Self Reported Normal  CBC     Status: None   Collection Time: 08/13/21 11:15 AM  Result Value Ref Range   WBC 5.7 4.0 - 10.5 K/uL   RBC 4.52 3.87 - 5.11 Mil/uL   Platelets 304.0 150.0 - 400.0 K/uL   Hemoglobin 13.4 12.0 - 15.0 g/dL   HCT 40.6 36.0 - 46.0 %   MCV 89.9 78.0 - 100.0 fl   MCHC 33.0 30.0 - 36.0 g/dL   RDW 13.8 11.5 - 15.5 %  Comp Met (CMET)     Status: None   Collection Time: 08/13/21 11:15 AM  Result Value Ref Range   Sodium 140 135 - 145 mEq/L   Potassium 4.1 3.5 - 5.1 mEq/L   Chloride 105 96 - 112 mEq/L   CO2 28 19 - 32 mEq/L   Glucose, Bld 90 70 - 99 mg/dL   BUN 10 6 - 23 mg/dL   Creatinine, Ser 0.66 0.40 - 1.20 mg/dL   Total Bilirubin 0.4 0.2 - 1.2 mg/dL   Alkaline Phosphatase 83 39 - 117 U/L   AST 15 0 - 37 U/L   ALT 15 0 - 35 U/L   Total Protein 6.9 6.0 - 8.3 g/dL   Albumin 4.2 3.5 - 5.2 g/dL   GFR 101.37 >60.00 mL/min    Comment: Calculated using the CKD-EPI Creatinine Equation (2021)   Calcium 9.2 8.4 - 10.5 mg/dL  TSH     Status:  None   Collection Time: 08/13/21 11:15 AM  Result Value Ref Range   TSH 1.14 0.35 - 5.50 uIU/mL  T4, free     Status: None   Collection Time: 08/13/21 11:15 AM  Result Value Ref Range   Free T4 0.83 0.60 - 1.60 ng/dL    Comment: Specimens from patients who are undergoing biotin therapy and /or ingesting biotin supplements may contain high levels of biotin.  The higher biotin concentration  in these specimens interferes with this Free T4 assay.  Specimens that contain high levels  of biotin may cause false high results for this Free T4 assay.  Please interpret results in light of the total clinical presentation of the patient.    Hemoglobin A1c     Status: None   Collection Time: 08/13/21 11:15 AM  Result Value Ref Range   Hgb A1c MFr Bld 5.7 4.6 - 6.5 %    Comment: Glycemic Control Guidelines for People with Diabetes:Non Diabetic:  <6%Goal of Therapy: <7%Additional Action Suggested:  >8%   Lipid panel     Status: Abnormal   Collection Time: 08/13/21 11:15 AM  Result Value Ref Range   Cholesterol 201 (H) 0 - 200 mg/dL    Comment: ATP III Classification       Desirable:  < 200 mg/dL               Borderline High:  200 - 239 mg/dL          High:  > = 240 mg/dL   Triglycerides 69.0 0.0 - 149.0 mg/dL    Comment: Normal:  <150 mg/dLBorderline High:  150 - 199 mg/dL   HDL 60.30 >39.00 mg/dL   VLDL 13.8 0.0 - 40.0 mg/dL   LDL Cholesterol 127 (H) 0 - 99 mg/dL   Total CHOL/HDL Ratio 3     Comment:                Men          Women1/2 Average Risk     3.4          3.3Average Risk          5.0          4.42X Average Risk          9.6          7.13X Average Risk          15.0          11.0                       NonHDL 141.03     Comment: NOTE:  Non-HDL goal should be 30 mg/dL higher than patient's LDL goal (i.e. LDL goal of < 70 mg/dL, would have non-HDL goal of < 100 mg/dL)     ROS: 14 pt review of systems performed and negative (unless mentioned in an HPI)  Objective: BP 113/76   Pulse 62    Temp 97.8 F (36.6 C) (Oral)   Ht 5' 7.5" (1.715 m)   Wt 246 lb (111.6 kg)   SpO2 99%   BMI 37.96 kg/m  Gen: Afebrile. No acute distress.  Nontoxic, very pleasant, obese female HENT: AT. [place]. Bilateral TM visualized and normal in appearance. MMM. Bilateral nares erythema, swelling or drainage. Throat without erythema or exudates.  No cough.  No hoarseness. Eyes:Pupils Equal Round Reactive to light, Extraocular movements intact,  Conjunctiva without redness, discharge or icterus. Neck/lymp/endocrine: Supple, no lymphadenopathy, no thyromegaly CV: RRR no murmur, no edema, +2/4 P posterior tibialis pulses Chest: CTAB, no wheeze or crackles Abd: Soft.  Obese. NTND. BS present.  No masses palpated.  Skin: No rashes, purpura or petechiae.  Neuro: Normal gait. PERLA. EOMi. Alert. Oriented x3 Psych: Normal affect, dress and demeanor. Normal speech. Normal thought content and judgment.  Assessment/plan: Stacy Solomon is a 50 y.o. female present for est care Establishing care with new doctor,  encounter for Mixed hyperlipidemia/Class 2 obesity due to excess calories without serious comorbidity with body mass index (BMI) of 37.0 to 37.9 in adult She has been able to control her cholesterol with diet.  She had lost weight.  Last cholesterol showed total cholesterol 211, HDL 51, LDL 136, triglycerides 118. - CBC - Comp Met (CMET) - TSH - T4, free - Lipid panel Prediabetes - Hemoglobin A1c Skin lesions - Ambulatory referral to Dermatology Colon cancer screening - Ambulatory referral to Gastroenterology Need for hepatitis C screening test - Hepatitis C antibody Routine general medical examination at a health care facility Colonoscopy: She reports she has never had a screen. No fhx. > referral placed.  Mammogram: FHX GM. completed:07/10/2021 ([facility]- would want completed at [facility] now that she moved) Cervical cancer screening: last pap: 05/13/2017, results: NL/- HPV, completed by:  PCP Immunizations: tdap ?, Influenza declined (encouraged yearly), zostavax declined Infectious disease screening: HIV declined, Hep C agreeable to screen today DEXA:routine screen Patient was encouraged to exercise greater than 150 minutes a week. Patient was encouraged to choose a diet filled with fresh fruits and vegetables, and lean meats. AVS provided to patient today for education/recommendation on gender specific health and safety maintenance.   Return in about 1 year (around 08/14/2022) for CPE (30 min) w/ PAP.  Orders Placed This Encounter  Procedures   HM MAMMOGRAPHY   Hepatitis C antibody   HM PAP SMEAR   CBC   Comp Met (CMET)   TSH   T4, free   Hemoglobin A1c   Lipid panel   Ambulatory referral to Dermatology   Ambulatory referral to Gastroenterology   No orders of the defined types were placed in this encounter.  Referral Orders         Ambulatory referral to Dermatology         Ambulatory referral to Gastroenterology       Note is dictated utilizing voice recognition software. Although note has been proof read prior to signing, occasional typographical errors still can be missed. If any questions arise, please do not hesitate to call for verification.  Electronically signed by: Howard Pouch, DO [place]

## 2021-08-14 LAB — HEPATITIS C ANTIBODY
Hepatitis C Ab: NONREACTIVE
SIGNAL TO CUT-OFF: <0.02 (ref <1.00)

## 2021-10-01 ENCOUNTER — Ambulatory Visit: Admitting: Family Medicine

## 2021-10-01 ENCOUNTER — Telehealth: Payer: Self-pay

## 2021-10-01 NOTE — Telephone Encounter (Signed)
Pt agreed to OV at 4 for ear pain x 3 weeks; neg covid test 2 weeks ago at [facility] and was given abx at that time. FYI

## 2021-10-01 NOTE — Telephone Encounter (Signed)
[facility] - Client Nonclinical Telephone Record  [facility] Client [place] [facility] - Client Client Site [place] - Day Provider Raoul Pitch, [place] Type Call Who Is Calling Patient / Member / Family / [place] Name Liliahna Cudd Phone Number [phone] Patient Name Stacy Solomon Patient DOB 10-07-1971 Call Type Message Only Information Provided Reason for Call Request to Schedule Office Appointment Initial Comment Caller states that she is wanting to schedule an appt Disp. Time Disposition Final User 09/30/2021 5:06:37 PM General Information Provided Yes Juline Patch Call Closed By: Juline Patch Transaction Date/Time: 09/30/2021 5:04:29 PM (ET)

## 2021-10-04 ENCOUNTER — Other Ambulatory Visit: Payer: Self-pay

## 2021-10-04 ENCOUNTER — Ambulatory Visit (INDEPENDENT_AMBULATORY_CARE_PROVIDER_SITE_OTHER): Admitting: Family Medicine

## 2021-10-04 ENCOUNTER — Encounter: Payer: Self-pay | Admitting: Family Medicine

## 2021-10-04 VITALS — BP 118/80 | HR 70 | Temp 97.7°F | Ht 67.5 in | Wt 251.4 lb

## 2021-10-04 DIAGNOSIS — J069 Acute upper respiratory infection, unspecified: Secondary | ICD-10-CM

## 2021-10-04 DIAGNOSIS — H6983 Other specified disorders of Eustachian tube, bilateral: Secondary | ICD-10-CM

## 2021-10-04 MED ORDER — FLUTICASONE PROPIONATE 50 MCG/ACT NA SUSP: 2.0000 | Freq: Every day | NASAL | 1 refills | Status: DC

## 2021-10-04 NOTE — Progress Notes (Signed)
OFFICE VISIT  10/04/2021  CC:  Chief Complaint  Patient presents with   Ears clogged    A little over 2 weeks ago; given abx at [facility]. Was given Sudafed and Ibuprofen, second time tried using Claritin and Mucinex otc.    HPI:    Patient is a 50 y.o. female who presents for "ears clogged".  INTERIM HX: Stacy Solomon reports that about 2 weeks ago she developed an upper respiratory infection and cough.  Her ears began to feel clogged up then.  No ear pain or drainage.  No fever.  She was having left-sided maxillary sinus pain.  She went to an urgent care and they prescribed a Z-Pak and recommended Sudafed and antihistamine.  Sudafed seemed to help the headache and her coughing resolved.  She has minimal nasal mucus at this time.  Her ears has continued to feel blocked.  No vertigo.  No ringing. She has not tried any nasal sprays but was doing Neti pot rinses back when she had her cold symptoms. Antibiotics were completed about 5 days ago.  Just a few days ago she began to feel discomfort and tenderness to touch in left lateral neck soft tissues.  No headaches, fever, chills, malaise, body aches, nausea, vomiting, or diarrhea.    Past Medical History:  Diagnosis Date   Anxiety state 11/13/2011   Formatting of this note might be different from the original. 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 STORY: offered to put her on sleep medicine but she declined offer   Leukocytosis 01/28/2016   Panic attacks    Vitamin D deficiency     Past Surgical History:  Procedure Laterality Date   BREAST REDUCTION SURGERY Bilateral    ENDOMETRIAL ABLATION  2008   LAPAROSCOPIC ABDOMINAL EXPLORATION      Outpatient Medications Prior to Visit  Medication Sig Dispense Refill    Docosahexaenoic Acid (OCEAN BLUE OPTIMAL HEALTH PO) Take 1 capsule by mouth daily. (Patient not taking: Reported on 10/04/2021)     ibuprofen (ADVIL,MOTRIN) 600 MG tablet Take 1 tablet (600 mg total) by mouth every 6 (six) hours as needed. (Patient not taking: Reported on 10/04/2021) 30 tablet 0   No facility-administered medications prior to visit.    No Known Allergies  ROS As per HPI  PE: Vitals with BMI 10/04/2021 08/13/2021 04/22/2018  Height 5' 7.5" 5' 7.5" -  Weight 251 lbs 6 oz 246 lbs -  BMI 67.12 45.80 -  Systolic 998 338 250  Diastolic 80 76 79  Pulse 70 62 -   VS: noted--normal. Gen: alert, NAD, NONTOXIC APPEARING. HEENT: eyes without injection, drainage, or swelling.  Ears: EACs clear, TMs retracted and immobile bilaterally.  No TM erythema, no visible middle ear fluid.  Nose: Scant amount of clear rhinorrhea, with minimal dried, crusty exudate adherent to mildly edematous but non-injected mucosa.  No purulent d/c.  No paranasal sinus TTP.  No facial swelling.  Throat and mouth without focal lesion.  No pharyngial swelling, erythema, or exudate.   Neck: supple, no LAD.  Moderate TTP over L SCM region on left but no mass/adenopathy palpable. LUNGS: CTA bilat, nonlabored resps.   CV: RRR, no m/r/g. EXT: no c/c/e SKIN: no rash   LABS:  none  IMPRESSION AND PLAN:  #1.  Bilateral eustachian tube dysfunction/obstruction. No sign of purulent otitis media or serous otitis media. I think this is residual from her URI and should clear up over time.  We will add Flonase daily and saline nasal spray twice a day at this time.  Okay to stop antihistamine.  She will call back if her left-sided soft tissue neck tenderness does not resolve over the next week or so.  Low suspicion of bacterial lymphadenitis at this time.  An After Visit Summary was printed and given to the patient.  FOLLOW UP: No follow-ups on file.  Signed:  Crissie Sickles, MD           10/04/2021

## 2021-11-01 ENCOUNTER — Other Ambulatory Visit: Payer: Self-pay

## 2021-11-01 ENCOUNTER — Encounter: Payer: Self-pay | Admitting: Family Medicine

## 2021-11-01 ENCOUNTER — Ambulatory Visit (INDEPENDENT_AMBULATORY_CARE_PROVIDER_SITE_OTHER): Admitting: Family Medicine

## 2021-11-01 VITALS — BP 129/92 | HR 75 | Temp 97.5°F | Ht 67.5 in | Wt 252.2 lb

## 2021-11-01 DIAGNOSIS — H6591 Unspecified nonsuppurative otitis media, right ear: Secondary | ICD-10-CM | POA: Diagnosis not present

## 2021-11-01 DIAGNOSIS — J069 Acute upper respiratory infection, unspecified: Secondary | ICD-10-CM | POA: Diagnosis not present

## 2021-11-01 MED ORDER — AMOXICILLIN 875 MG PO TABS: 875.0000 mg | ORAL_TABLET | Freq: Two times a day (BID) | ORAL | 0 refills | Status: AC

## 2021-11-01 NOTE — Progress Notes (Signed)
OFFICE VISIT  11/01/2021  CC:  Chief Complaint  Patient presents with   Sinus congestion    X3 days; used Sudafed, Nightquil, Dayquil, Tylenol cold and flu    HPI:    Patient is a 51 y.o. female who presents for "sinus congestion".  HPI: Onset about 2 days ago--severe nasal congestion, slight dry cough, intermittent mild scratchy throat, significant bilateral ear fullness/stopped up.  Has some fatigue with this.  No body aches. I saw her 10/04/2021 and she did have significant bilateral eustachian tube dysfunction symptoms then.  She did not have signs of otitis media at that time.  She says her right ear has not improved at all since that time. No fever, shortness of breath, chest tightness, chest pain, nausea, vomiting, or diarrhea. No home COVID testing has been done. No known COVID exposures. She has had COVID-vaccine initial series but she does not think she had any boosters.   Past Medical History:  Diagnosis Date   Anxiety state 11/13/2011   Formatting of this note might be different from the original. 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 STORY: offered to put her on sleep medicine but she declined offer   Leukocytosis 01/28/2016   Panic attacks    Vitamin D deficiency     Past Surgical History:  Procedure Laterality Date   BREAST REDUCTION SURGERY Bilateral    ENDOMETRIAL ABLATION  2008   LAPAROSCOPIC ABDOMINAL EXPLORATION      Outpatient Medications Prior to Visit  Medication Sig Dispense Refill   Docosahexaenoic Acid (OCEAN BLUE OPTIMAL HEALTH PO) Take 1 capsule by mouth daily. (Patient not taking: Reported on 10/04/2021)     fluticasone (FLONASE) 50 MCG/ACT nasal spray Place 2 sprays into both nostrils daily. (Patient not taking:  Reported on 11/01/2021) 16 g 1   No facility-administered medications prior to visit.    No Known Allergies  ROS As per HPI  PE: Vitals with BMI 11/01/2021 10/04/2021 08/13/2021  Height 5' 7.5" 5' 7.5" 5' 7.5"  Weight 252 lbs 3 oz 251 lbs 6 oz 246 lbs  BMI 38.89 18.56 31.49  Systolic 702 637 858  Diastolic 92 80 76  Pulse 75 70 62     Physical Exam  VS: noted--normal. Gen: alert, NAD, NONTOXIC APPEARING. HEENT: eyes without injection, drainage, or swelling.  Ears: EACs clear, TMs with normal light reflex and landmarks, bilateral TM retraction right greater than left.  Serous fluid noted more notable in right than left.  Nose: Clear rhinorrhea, with some dried, crusty exudate adherent to mildly injected mucosa.  No purulent d/c.  No paranasal sinus TTP.  No facial swelling.  Throat and mouth without focal lesion.  No pharyngial swelling, erythema, or exudate.   Neck: supple, no LAD.   LUNGS: CTA bilat, nonlabored resps.   CV: RRR, no m/r/g. EXT: no c/c/e SKIN: no rash   LABS:    Chemistry      Component Value Date/Time   NA 140 08/13/2021 1115   K 4.1 08/13/2021 1115   CL 105 08/13/2021 1115   CO2 28 08/13/2021 1115   BUN 10 08/13/2021 1115   CREATININE 0.66 08/13/2021 1115      Component Value Date/Time   CALCIUM 9.2 08/13/2021 1115  ALKPHOS 83 08/13/2021 1115   AST 15 08/13/2021 1115   ALT 15 08/13/2021 1115   BILITOT 0.4 08/13/2021 1115      IMPRESSION AND PLAN:  URI with cough. Serous otitis media, right greater than left.  This is superimposed on eustachian tube dysfunction. COVID/flu/RSV swab today.  Result will be back tomorrow. Will treat ears with Amoxil 875 twice daily x10 days. Continue home symptomatic care with over-the-counter cold medicines and Tylenol. If COVID returns positive she is within treatment window for antiviral (her risk factor for complication from COVID is BMI 38). GFR is 101.  An After Visit Summary was printed and given to the  patient.  FOLLOW UP: No follow-ups on file.  Signed:  Crissie Sickles, MD           11/01/2021

## 2021-11-02 LAB — COVID-19, FLU A+B AND RSV
Influenza A, NAA: NOT DETECTED
Influenza B, NAA: NOT DETECTED
RSV, NAA: NOT DETECTED
SARS-CoV-2, NAA: NOT DETECTED

## 2021-11-14 ENCOUNTER — Encounter: Payer: Self-pay | Admitting: Internal Medicine

## 2021-12-19 ENCOUNTER — Encounter

## 2021-12-19 ENCOUNTER — Telehealth: Payer: Self-pay | Admitting: *Deleted

## 2021-12-19 NOTE — Telephone Encounter (Signed)
Patient no show/answer PV appointment for today. I called patient x3, no answer, left a message for the patient to call us back today before 5 pm or the PV and procedure will be cancelled. ? ?

## 2021-12-19 NOTE — Telephone Encounter (Signed)
Patient did not call us back to reschedule today. PV and colon cancelled, no show letter sent to pt's Mychart.

## 2022-01-01 ENCOUNTER — Other Ambulatory Visit: Payer: Self-pay

## 2022-01-01 ENCOUNTER — Ambulatory Visit (AMBULATORY_SURGERY_CENTER): Admitting: *Deleted

## 2022-01-01 VITALS — Ht 68.0 in | Wt 248.0 lb

## 2022-01-01 DIAGNOSIS — Z1211 Encounter for screening for malignant neoplasm of colon: Secondary | ICD-10-CM

## 2022-01-01 MED ORDER — PEG 3350-KCL-NA BICARB-NACL 420 G PO SOLR
4000.0000 mL | Freq: Once | ORAL | 0 refills | Status: AC
Start: 2022-01-01 — End: 2022-01-01

## 2022-01-01 NOTE — Progress Notes (Signed)

## 2022-01-02 ENCOUNTER — Encounter: Admitting: Internal Medicine

## 2022-01-13 ENCOUNTER — Telehealth: Payer: Self-pay | Admitting: Family Medicine

## 2022-01-13 ENCOUNTER — Other Ambulatory Visit: Payer: Self-pay

## 2022-01-13 ENCOUNTER — Encounter: Payer: Self-pay | Admitting: Family Medicine

## 2022-01-13 ENCOUNTER — Ambulatory Visit (INDEPENDENT_AMBULATORY_CARE_PROVIDER_SITE_OTHER): Admitting: Family Medicine

## 2022-01-13 VITALS — BP 131/85 | HR 64 | Temp 97.9°F | Ht 68.0 in | Wt 254.0 lb

## 2022-01-13 DIAGNOSIS — Z6838 Body mass index (BMI) 38.0-38.9, adult: Secondary | ICD-10-CM

## 2022-01-13 DIAGNOSIS — E782 Mixed hyperlipidemia: Secondary | ICD-10-CM

## 2022-01-13 DIAGNOSIS — R7309 Other abnormal glucose: Secondary | ICD-10-CM

## 2022-01-13 DIAGNOSIS — E6609 Other obesity due to excess calories: Secondary | ICD-10-CM | POA: Diagnosis not present

## 2022-01-13 LAB — T4, FREE: Free T4: 0.75 ng/dL (ref 0.60–1.60)

## 2022-01-13 LAB — TSH: TSH: 1.35 u[IU]/mL (ref 0.35–5.50)

## 2022-01-13 MED ORDER — BUPROPION HCL ER (SR) 150 MG PO TB12: 150.0000 mg | ORAL_TABLET | Freq: Two times a day (BID) | ORAL | 22 refills | Status: DC

## 2022-01-13 MED ORDER — NALTREXONE HCL 50 MG PO TABS: 25.0000 mg | ORAL_TABLET | Freq: Two times a day (BID) | ORAL | 2 refills | Status: DC

## 2022-01-13 NOTE — Progress Notes (Signed)
? ? ? ?This visit occurred during the SARS-CoV-2 public health emergency.  Safety protocols were in place, including screening questions prior to the visit, additional usage of staff PPE, and extensive cleaning of exam room while observing appropriate contact time as indicated for disinfecting solutions.  ? ? ?Stacy Solomon , August 25, 1971, 51 y.o., female ?MRN: [id number] ?Patient Care Team  ?  Relationship Specialty Notifications Start End  ?Ma Hillock, DO PCP - General Family Medicine  08/13/21   ?Camillo Flaming, [facility] Referring Physician Optometry  08/13/21   ? ? ?Chief Complaint  ?Patient presents with  ? Weight Check  ?  No food log;   ? ?  ?Subjective: Pt presents for an OV with weight loss counseling.  ?Weight 254- 01/13/2022. ?Patient reports she has struggled with her weight in the past.  Currently she feels she has a "addiction" to food.  She has the resources at home for exercise, but is unmotivated at this time.  She will refrain from buying sweets at the grocery store, and then when she has cravings at home she still ends up eating things like cookies because she will make a fresh batch of homemade cookies.  She has gone to the grocery store and bought sweets and ate a package of cookies on her way home.  She reports she is embarrassed how she has become addicted to food.  She states she feels like she is bored and will eat food for comfort.  She reports she has empty nest now that her child has gone off to college.  She reports she used to be very active with her children's education and activities, which kept her busy and more active. ? ?Depression screen [facility] 2/9 08/13/2021  ?Decreased Interest 0  ?Down, Depressed, Hopeless 0  ?PHQ - 2 Score 0  ? ? ?No Known Allergies ?Social History  ? ?Social History Narrative  ? Marital status/children/pets: Married  ? Safety:   ?   -Wears a bicycle helmet riding a bike: Yes  ?   -smoke alarm in the home:Yes  ?   - wears seatbelt: Yes  ?   - Feels safe in  their relationships: Yes  ?   ? ?Past Medical History:  ?Diagnosis Date  ? Anxiety state 11/13/2011  ? Formatting of this note might be different from the original. STORY: Will be flying to [place] and requested something to have prior to flight  ? Chronic fatigue syndrome 02/04/2011  ? 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 Hypertension   ? Insomnia 02/04/2011  ? Formatting of this note might be different from the original. STORY: offered to put her on sleep medicine but she declined offer  ? Leukocytosis 01/28/2016  ? Panic attacks   ? Sleep apnea   ? Vitamin D deficiency   ? ?Past Surgical History:  ?Procedure Laterality Date  ? BREAST REDUCTION SURGERY Bilateral   ? ENDOMETRIAL ABLATION  2008  ? LAPAROSCOPIC ABDOMINAL EXPLORATION    ? ?Family History  ?Problem Relation Age of Onset  ? Hypertension Mother   ? Hyperlipidemia Mother   ? Diabetes Mother   ? Arthritis Mother   ? Hypertension Maternal Grandmother   ? Diabetes Maternal Grandmother   ? Arthritis Maternal Grandmother   ? Diabetes Maternal Grandfather   ? Heart disease Maternal Grandfather   ? Hypertension Maternal Grandfather   ? Heart attack Maternal Grandfather   ? Breast cancer Paternal Grandmother   ? Colon cancer Neg Hx   ?  Colon polyps Neg Hx   ? Esophageal cancer Neg Hx   ? Stomach cancer Neg Hx   ? Rectal cancer Neg Hx   ? ?Allergies as of 01/13/2022   ?No Known Allergies ?  ? ?  ?Medication List  ?  ? ?  ? Accurate as of January 13, 2022  4:26 PM. If you have any questions, ask your nurse or doctor.  ?  ?  ? ?  ? ?STOP taking these medications   ? ?fluticasone 50 MCG/ACT nasal spray ?Commonly known as: FLONASE ?Stopped by: Howard Pouch, DO ?  ?[place] PO ?Stopped by: Howard Pouch, DO ?  ? ?  ? ?TAKE these medications   ? ?buPROPion 150 MG 12 hr tablet ?Commonly known as: Wellbutrin SR ?Take 1 tablet (150 mg total) by mouth 2 (two) times daily. ?Started by: Howard Pouch, DO ?  ?naltrexone 50 MG tablet ?Commonly known  as: DEPADE ?Take 0.5 tablets (25 mg total) by mouth in the morning and at bedtime. ?Started by: Howard Pouch, DO ?  ? ?  ? ? ?All past medical history, surgical history, allergies, family history, immunizations andmedications were updated in the EMR today and reviewed under the history and medication portions of their EMR.    ? ?ROS ?Negative, with the exception of above mentioned in HPI ? ? ?Objective:  ?BP 131/85   Pulse 64   Temp 97.9 ?F (36.6 ?C) (Oral)   Ht '5\' 8"'$  (1.727 m)   Wt 254 lb (115.2 kg)   SpO2 100%   BMI 38.62 kg/m?  ?Body mass index is 38.62 kg/m?Marland Kitchen ?Physical Exam ?Vitals and nursing note reviewed.  ?Constitutional:   ?   General: She is not in acute distress. ?   Appearance: Normal appearance. She is normal weight. She is not ill-appearing or toxic-appearing.  ?Eyes:  ?   Extraocular Movements: Extraocular movements intact.  ?   Conjunctiva/sclera: Conjunctivae normal.  ?   Pupils: Pupils are equal, round, and reactive to light.  ?Cardiovascular:  ?   Rate and Rhythm: Normal rate and regular rhythm.  ?   Heart sounds: No murmur heard. ?Pulmonary:  ?   Effort: Pulmonary effort is normal.  ?   Breath sounds: Normal breath sounds.  ?Neurological:  ?   Mental Status: She is alert and oriented to person, place, and time. Mental status is at baseline.  ?Psychiatric:     ?   Mood and Affect: Mood normal.     ?   Behavior: Behavior normal.     ?   Thought Content: Thought content normal.     ?   Judgment: Judgment normal.  ? ? ? ?No results found. ?No results found. ?No results found for this or any previous visit (from the past 24 hour(s)). ? ?Assessment/Plan: ?LISVET RASHEED is a 51 y.o. female present for OV for  ?Elevated glucose/Mixed hyperlipidemia/Class 2 obesity due to excess calories without serious comorbidity with body mass index (BMI) of 38.0 to 38.9 in adult ?Patient will start food diary/log for at least 1-2 weeks. ?Start Depade half tab twice daily before meal. ?Start Wellbutrin  twice daily ? ?Patient was counseled on exercise, calorie counting, weight loss and potential medications to help with weight loss today. ?-Patient was encouraged to download calorie counting app such as my fitness pal.  ?-Patient was educated on dietary changes to not only lose weight but to eat healthy.   ?-Patient was educated on exercise goal of  150 minutes a week (plus warm up and cool down) of cardiovascular exercise.  ?-Patient was encouraged to maintain adequate water consumption of at least 100 ounces a day, more if exercising/sweating. ?Thyroid panel and insulin resistance score collected today patient is fasting.  We will attempt injectable such as Wegovy/Ozempic/Mounjaro depending upon results. ?Follow-up between 4-6 weeks depending upon injectables coverage. ? ? ?Reviewed expectations re: course of current medical issues. ?Discussed self-management of symptoms. ?Outlined signs and symptoms indicating need for more acute intervention. ?Patient verbalized understanding and all questions were answered. ?Patient received an After-Visit Summary. ? ? ? ?Orders Placed This Encounter  ?Procedures  ? TSH  ? T4, free  ? Cardio IQ Insulin Resistance Panel with Score  ? ?Meds ordered this encounter  ?Medications  ? buPROPion (WELLBUTRIN SR) 150 MG 12 hr tablet  ?  Sig: Take 1 tablet (150 mg total) by mouth 2 (two) times daily.  ?  Dispense:  60 tablet  ?  Refill:  22  ? naltrexone (DEPADE) 50 MG tablet  ?  Sig: Take 0.5 tablets (25 mg total) by mouth in the morning and at bedtime.  ?  Dispense:  30 tablet  ?  Refill:  2  ? ?Referral Orders  ?No referral(s) requested today  ? ? ? ?Note is dictated utilizing voice recognition software. Although note has been proof read prior to signing, occasional typographical errors still can be missed. If any questions arise, please do not hesitate to call for verification.  ? ?electronically signed by: ? ?Howard Pouch, DO  ?[facility] [place] ? ? ? ?

## 2022-01-13 NOTE — Telephone Encounter (Signed)
FYI: Pt had a questions about medication that was prescribed to her today. ? ?If she starts taking medication how does she ?come off of it? How long does she take the medication that was prescribed to her for today's visit? ? ?What she is taking it for is different than what the medication says it's for. ?Should be be concerned about this? ? ? ? ?Pt cell: [phone] ? ? ? ? ?

## 2022-01-13 NOTE — Telephone Encounter (Signed)
ERROR

## 2022-01-13 NOTE — Patient Instructions (Signed)
Start food log.  ?Start depade twice a day before meals.  ? ?Start Wellbutrin.   ? ?Great to see you today.  ?I have refilled the medication(s) we provide.  ? ?If labs were collected, we will inform you of lab results once received either by echart message or telephone call.  ? - echart message- for normal results that have been seen by the patient already.  ? - telephone call: abnormal results or if patient has not viewed results in their echart. ? ? ? ? ? ?

## 2022-01-14 NOTE — Telephone Encounter (Signed)
Called pt she states she referring to naltrexone (DEPADE) 50 MG tablet. Pt wants answers to below prior to starting any meds.  ?

## 2022-01-14 NOTE — Telephone Encounter (Signed)
She can take for as long or as short as of time as she desires.  ?There is no need to taper or "come off" of it any special manner- depade is just stopped if desired.  ?As we discussed during her visit, the two  meds prescribed today are the components of "Contrave" which is a weight loss medicine. It is used for weight loss. ?

## 2022-01-14 NOTE — Telephone Encounter (Signed)
Spoke with pt regarding medication and instructions.  

## 2022-01-17 ENCOUNTER — Encounter: Admitting: Internal Medicine

## 2022-01-17 ENCOUNTER — Telehealth: Payer: Self-pay | Admitting: Internal Medicine

## 2022-01-17 NOTE — Telephone Encounter (Signed)
Called patient this morning-- she advised me she called in Monday morning to cancel due to her husband's work schedule-- said she was put on hold and had the option to leave a message instead of holding.  ?

## 2022-01-20 ENCOUNTER — Ambulatory Visit (INDEPENDENT_AMBULATORY_CARE_PROVIDER_SITE_OTHER): Admitting: Family Medicine

## 2022-01-20 ENCOUNTER — Encounter: Payer: Self-pay | Admitting: Family Medicine

## 2022-01-20 ENCOUNTER — Other Ambulatory Visit: Payer: Self-pay

## 2022-01-20 VITALS — BP 137/82 | HR 94 | Temp 97.7°F | Ht 68.0 in | Wt 248.0 lb

## 2022-01-20 DIAGNOSIS — T50905A Adverse effect of unspecified drugs, medicaments and biological substances, initial encounter: Secondary | ICD-10-CM

## 2022-01-20 DIAGNOSIS — F410 Panic disorder [episodic paroxysmal anxiety] without agoraphobia: Secondary | ICD-10-CM | POA: Diagnosis not present

## 2022-01-20 DIAGNOSIS — F419 Anxiety disorder, unspecified: Secondary | ICD-10-CM | POA: Diagnosis not present

## 2022-01-20 MED ORDER — LORAZEPAM 0.5 MG PO TABS: ORAL_TABLET | ORAL | 0 refills | Status: DC

## 2022-01-20 NOTE — Progress Notes (Signed)
OFFICE VISIT ? ?01/20/2022 ? ?CC:  ?Chief Complaint  ?Patient presents with  ? Anxiety  ?  Pt has been having anxiety since yesterday/this morning. Unable to take meds. Originally prescribed meds for weight loss  ? ? ?Patient is a 51 y.o. female who presents for anxiety. ?Her PCP is Dr. Raoul Pitch and I am seeing patient today in her absence. ? ?HPI: ?Patient saw Dr. Raoul Pitch 01/13/2022 and was started on bupropion SR 150 twice daily and naltrexone for assistance with weight management. ? ?About 2 days ago began to notice markedly increased anxiety level, some tremulousness, a pit in her stomach and diarrhea.  No fever.  No nausea or vomiting.  Feels that her voice is somewhat shaky, feels like she has to cry all the time in the last 24 hours.  Has some headache today.  Her last dose of the naltrexone and Wellbutrin was last night. ?This type of anxiety was not present prior to her beginning the medications. ? ?Review of systems: No confusion or disorientation. ?No muscle aches.  She is fatigued today.  No rash.  No swelling. ?No vision or hearing changes.  No focal weakness.  No paresthesias. ? ?Past Medical History:  ?Diagnosis Date  ? Anxiety state 11/13/2011  ? Formatting of this note might be different from the original. STORY: Will be flying to [place] and requested something to have prior to flight  ? Chronic fatigue syndrome 02/04/2011  ? 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 Hypertension   ? Insomnia 02/04/2011  ? Formatting of this note might be different from the original. STORY: offered to put her on sleep medicine but she declined offer  ? Leukocytosis 01/28/2016  ? Panic attacks   ? Sleep apnea   ? Vitamin D deficiency   ? ? ?Past Surgical History:  ?Procedure Laterality Date  ? BREAST REDUCTION SURGERY Bilateral   ? ENDOMETRIAL ABLATION  2008  ? LAPAROSCOPIC ABDOMINAL EXPLORATION    ? ? ?Outpatient Medications Prior to Visit  ?Medication Sig Dispense Refill  ? buPROPion (WELLBUTRIN SR) 150 MG  12 hr tablet Take 1 tablet (150 mg total) by mouth 2 (two) times daily. 60 tablet 22  ? naltrexone (DEPADE) 50 MG tablet Take 0.5 tablets (25 mg total) by mouth in the morning and at bedtime. 30 tablet 2  ? ?No facility-administered medications prior to visit.  ? ? ?No Known Allergies ? ?ROS ?As per HPI ? ?PE: ? ?  01/20/2022  ? 11:22 AM 01/13/2022  ? 10:38 AM 01/01/2022  ?  4:32 PM  ?Vitals with BMI  ?Height '5\' 8"'$  '5\' 8"'$  '5\' 8"'$   ?Weight 248 lbs 254 lbs 248 lbs  ?BMI 37.72 38.63 37.72  ?Systolic 322 025   ?Diastolic 82 85   ?Pulse 94 64   ? ? ? ?Physical Exam ? ?Wt Readings from Last 2 Encounters:  ?01/20/22 248 lb (112.5 kg)  ?01/13/22 254 lb (115.2 kg)  ? ? ?Gen: alert, oriented x 4, affect is anxious and tearful.  Lucid thinking and conversation noted. ?HEENT: PERRLA, EOMI.   ?Neck: no LAD, mass, or thyromegaly. ?CV: RRR, no m/r/g ?LUNGS: CTA bilat, nonlabored. ?NEURO: no tremor or tics noted on observation.  Coordination intact. ?CN 2-12 grossly intact bilaterally, strength 5/5 in all extremeties.  No ataxia. ?DTRs ar 1+ bilateral knees and ankles, no clonus.  Triceps and biceps 1+ bilaterally. ? ?LABS:  ?Last metabolic panel ?Lab Results  ?Component Value Date  ? GLUCOSE 90 08/13/2021  ?  NA 140 08/13/2021  ? K 4.1 08/13/2021  ? CL 105 08/13/2021  ? CO2 28 08/13/2021  ? BUN 10 08/13/2021  ? CREATININE 0.66 08/13/2021  ? GFRNONAA >60 04/22/2018  ? CALCIUM 9.2 08/13/2021  ? PROT 6.9 08/13/2021  ? ALBUMIN 4.2 08/13/2021  ? BILITOT 0.4 08/13/2021  ? ALKPHOS 83 08/13/2021  ? AST 15 08/13/2021  ? ALT 15 08/13/2021  ? ANIONGAP 7 04/22/2018  ? ?IMPRESSION AND PLAN: ? ?Severe anxiety.  Onset after starting Wellbutrin and naltrexone. ?She has some physical symptoms that could suggest very mild serotonin syndrome. ?Discontinue bupropion and naltrexone now. ?Lorazepam 0.5 mg, 1-2 tabs 3 times daily as needed, #10. ?Follow-up options discussed with patient-return in 2 to 3 days to see Dr. Raoul Pitch or myself. ?Patient chose at this  time to observe and return as needed. ? ?An After Visit Summary was printed and given to the patient. ? ?FOLLOW UP: Return if symptoms worsen or fail to improve. ? ?Signed:  Crissie Sickles, MD           01/20/2022 ? ?

## 2022-01-21 LAB — CARDIO IQ INSULIN RESISTANCE PANEL WITH SCORE
C-PEPTIDE, LC/MS/MS: 2.68 ng/mL — ABNORMAL HIGH (ref 0.68–2.16)
INSULIN, INTACT, LC/MS/MS: 24 u[IU]/mL — ABNORMAL HIGH (ref <=16)
Insulin Resistance Score: 95 — ABNORMAL HIGH (ref <=66)

## 2022-01-22 ENCOUNTER — Telehealth: Payer: Self-pay | Admitting: Family Medicine

## 2022-01-22 DIAGNOSIS — E88819 Insulin resistance, unspecified: Secondary | ICD-10-CM | POA: Insufficient documentation

## 2022-01-22 DIAGNOSIS — E6609 Other obesity due to excess calories: Secondary | ICD-10-CM

## 2022-01-22 DIAGNOSIS — E782 Mixed hyperlipidemia: Secondary | ICD-10-CM

## 2022-01-22 DIAGNOSIS — E8881 Metabolic syndrome: Secondary | ICD-10-CM | POA: Insufficient documentation

## 2022-01-22 DIAGNOSIS — R7303 Prediabetes: Secondary | ICD-10-CM

## 2022-01-22 NOTE — Telephone Encounter (Signed)
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 ?During her appointment we discussed trying to add an injection such as Ozempic to aid her with her insulin resistance. ? ?I also noted she had increased anxiety with a start of Wellbutrin/Depade and medication has been discontinued. ?GI side effects was most likely the Depade.  If desired she can try to take half tab once a day without the Wellbutrin. ? ?The Wellbutrin was likely responsible for the increased anxiety, and this can be a potential side effect in some people, although not commonly seen. ? ? ?I would encourage her to make an appointment to follow-up with this provider in about 2 weeks with her food diary/log as we discussed during her appointment.  And we can then review and go over all of our options available to her to help with her weight loss. ?In the meantime if she is still interested in the injections, I would encourage her to call her insurance to see if any of these are on her formulary for the indications of prediabetes/morbid obesity with hyperlipidemia. ? ? ?Thanks.  Hope she is feeling better. ?

## 2022-01-22 NOTE — Telephone Encounter (Signed)
LVM for pt to CB regarding results.  

## 2022-01-23 NOTE — Telephone Encounter (Signed)
Spoke with pt regarding labs and instructions.   

## 2022-06-12 ENCOUNTER — Emergency Department (HOSPITAL_BASED_OUTPATIENT_CLINIC_OR_DEPARTMENT_OTHER)

## 2022-06-12 ENCOUNTER — Emergency Department (HOSPITAL_BASED_OUTPATIENT_CLINIC_OR_DEPARTMENT_OTHER)
Admission: EM | Admit: 2022-06-12 | Discharge: 2022-06-12 | Disposition: A | Discharge status: Home / Self Care | Attending: Emergency Medicine | Admitting: Emergency Medicine

## 2022-06-12 ENCOUNTER — Encounter (HOSPITAL_BASED_OUTPATIENT_CLINIC_OR_DEPARTMENT_OTHER): Payer: Self-pay

## 2022-06-12 ENCOUNTER — Other Ambulatory Visit: Payer: Self-pay

## 2022-06-12 DIAGNOSIS — G5732 Lesion of lateral popliteal nerve, left lower limb: Secondary | ICD-10-CM

## 2022-06-12 DIAGNOSIS — G5782 Other specified mononeuropathies of left lower limb: Secondary | ICD-10-CM | POA: Diagnosis not present

## 2022-06-12 DIAGNOSIS — R200 Anesthesia of skin: Secondary | ICD-10-CM | POA: Diagnosis present

## 2022-06-12 LAB — COMPREHENSIVE METABOLIC PANEL
ALT: 20 U/L (ref 0–44)
AST: 19 U/L (ref 15–41)
Albumin: 3.9 g/dL (ref 3.5–5.0)
Alkaline Phosphatase: 74 U/L (ref 38–126)
Anion gap: 8 (ref 5–15)
BUN: 12 mg/dL (ref 6–20)
CO2: 27 mmol/L (ref 22–32)
Calcium: 9.4 mg/dL (ref 8.9–10.3)
Chloride: 105 mmol/L (ref 98–111)
Creatinine, Ser: 0.72 mg/dL (ref 0.44–1.00)
GFR, Estimated: >60 mL/min (ref >60)
Glucose, Bld: 100 mg/dL — ABNORMAL HIGH (ref 70–99)
Potassium: 4.6 mmol/L (ref 3.5–5.1)
Sodium: 140 mmol/L (ref 135–145)
Total Bilirubin: 0.2 mg/dL — ABNORMAL LOW (ref 0.3–1.2)
Total Protein: 7 g/dL (ref 6.5–8.1)

## 2022-06-12 LAB — CBC WITH DIFFERENTIAL/PLATELET
Abs Immature Granulocytes: 0.01 10*3/uL (ref 0.00–0.07)
Basophils Absolute: 0.1 10*3/uL (ref 0.0–0.1)
Basophils Relative: 1 %
Eosinophils Absolute: 0.1 10*3/uL (ref 0.0–0.5)
Eosinophils Relative: 2 %
HCT: 40.7 % (ref 36.0–46.0)
Hemoglobin: 13.5 g/dL (ref 12.0–15.0)
Immature Granulocytes: 0 %
Lymphocytes Relative: 33 %
Lymphs Abs: 2.6 10*3/uL (ref 0.7–4.0)
MCH: 29.7 pg (ref 26.0–34.0)
MCHC: 33.2 g/dL (ref 30.0–36.0)
MCV: 89.5 fL (ref 80.0–100.0)
Monocytes Absolute: 0.7 10*3/uL (ref 0.1–1.0)
Monocytes Relative: 9 %
Neutro Abs: 4.4 10*3/uL (ref 1.7–7.7)
Neutrophils Relative %: 55 %
Platelets: 291 10*3/uL (ref 150–400)
RBC: 4.55 MIL/uL (ref 3.87–5.11)
RDW: 13.6 % (ref 11.5–15.5)
WBC: 7.9 10*3/uL (ref 4.0–10.5)
nRBC: 0 % (ref 0.0–0.2)

## 2022-06-12 NOTE — ED Notes (Signed)
US at bedside

## 2022-06-12 NOTE — ED Provider Notes (Signed)
[facility] Provider Note   CSN: [id number] Arrival date & time: 06/12/22  1936     History  Chief Complaint  Patient presents with   Leg Numbness    Stacy Solomon is a 51 y.o. female.  No significant past medical history.  She said she noticed some heaviness in her left lower leg that started around noon today.  She is concerned she might have a blood clot because she just returned from a 12-hour car trip from the beach in [place].  She initially said her leg was numb but she said it is really not a change in sensation and not tingling.  She noticed when she walks that her foot just flops instead of comes down normally.  There is no pain in the leg.  No chest pain short of breath.  She has had a little bit of diarrhea today but did not think anything of it.  No fevers or chills.  The history is provided by the patient.  Extremity Weakness This is a new problem. The current episode started 6 to 12 hours ago. The problem occurs constantly. The problem has not changed since onset.Pertinent negatives include no chest pain, no abdominal pain, no headaches and no shortness of breath. The symptoms are aggravated by walking. Nothing relieves the symptoms. She has tried rest for the symptoms. The treatment provided no relief.       Home Medications Prior to Admission medications   Medication Sig Start Date End Date Taking? Authorizing Provider  buPROPion (WELLBUTRIN SR) 150 MG 12 hr tablet Take 1 tablet (150 mg total) by mouth 2 (two) times daily. 01/13/22   Kuneff, Renee A, DO  LORazepam (ATIVAN) 0.5 MG tablet 1-2 tabs po tid prn severe anxiety 01/20/22   McGowen, Adrian Blackwater, MD  naltrexone (DEPADE) 50 MG tablet Take 0.5 tablets (25 mg total) by mouth in the morning and at bedtime. 01/13/22   Kuneff, Renee A, DO      Allergies    Patient has no known allergies.    Review of Systems   Review of Systems  Constitutional:  Negative for fever.  Respiratory:   Negative for shortness of breath.   Cardiovascular:  Negative for chest pain.  Gastrointestinal:  Positive for diarrhea. Negative for abdominal pain.  Musculoskeletal:  Positive for extremity weakness. Negative for back pain.  Skin:  Negative for rash.  Neurological:  Negative for headaches.    Physical Exam Updated Vital Signs BP (!) 157/114 (BP Location: Right Arm)   Pulse 66   Temp 98.2 F (36.8 C)   Resp 18   Ht '5\' 8"'$  (1.727 m)   Wt 112.5 kg   SpO2 100%   BMI 37.71 kg/m  Physical Exam Vitals and nursing note reviewed.  Constitutional:      General: She is not in acute distress.    Appearance: Normal appearance. She is well-developed.  HENT:     Head: Normocephalic and atraumatic.  Eyes:     Conjunctiva/sclera: Conjunctivae normal.  Cardiovascular:     Rate and Rhythm: Normal rate and regular rhythm.     Heart sounds: No murmur heard. Pulmonary:     Effort: Pulmonary effort is normal. No respiratory distress.     Breath sounds: Normal breath sounds.  Abdominal:     Palpations: Abdomen is soft.     Tenderness: There is no abdominal tenderness. There is no guarding or rebound.  Musculoskeletal:        General:  Normal range of motion.     Cervical back: Neck supple.     Right lower leg: No edema.     Left lower leg: No edema.  Skin:    General: Skin is warm and dry.     Capillary Refill: Capillary refill takes less than 2 seconds.  Neurological:     General: No focal deficit present.     Mental Status: She is alert.     Sensory: No sensory deficit.     Motor: No weakness.     ED Results / Procedures / Treatments   Labs (all labs ordered are listed, but only abnormal results are displayed) Labs Reviewed  COMPREHENSIVE METABOLIC PANEL - Abnormal; Notable for the following components:      Result Value   Glucose, Bld 100 (*)    Total Bilirubin 0.2 (*)    All other components within normal limits  CBC WITH DIFFERENTIAL/PLATELET     EKG None  Radiology US Venous Img Lower  Left (DVT Study)  Result Date: 06/12/2022 CLINICAL DATA:  Left lower extremity pain and numbness EXAM: LEFT LOWER EXTREMITY VENOUS DOPPLER ULTRASOUND TECHNIQUE: Gray-scale sonography with compression, as well as color and duplex ultrasound, were performed to evaluate the deep venous system(s) from the level of the common femoral vein through the popliteal and proximal calf veins. COMPARISON:  None Available. FINDINGS: VENOUS Normal compressibility of the common femoral, superficial femoral, and popliteal veins, as well as the visualized calf veins. Visualized portions of profunda femoral vein and great saphenous vein unremarkable. No filling defects to suggest DVT on grayscale or color Doppler imaging. Doppler waveforms show normal direction of venous flow, normal respiratory plasticity and response to augmentation. Limited views of the contralateral common femoral vein are unremarkable. OTHER None. Limitations: none IMPRESSION: Negative. Electronically Signed   By: Placido Sou M.D.   On: 06/12/2022 21:59    Procedures Procedures    Medications Ordered in ED Medications - No data to display  ED Course/ Medical Decision Making/ A&P                           Medical Decision Making Amount and/or Complexity of Data Reviewed Labs: ordered.  This patient complains of left lower leg discomfort; this involves an extensive number of treatment Options and is a complaint that carries with it a high risk of complications and morbidity. The differential includes DVT, cellulitis, muscle injury, peripheral nerve injury, sciatica, arterial occlusion  I ordered, reviewed and interpreted labs, which included CBC normal chemistries normal LFTs normal  I ordered imaging studies which included duplex left lower extremity and I independently    visualized and interpreted imaging which showed no acute DVT Additional history obtained from patient's  husband Previous records obtained and reviewed in epic no recent admissions Social determinants considered, no significant barriers Critical Interventions: None  After the interventions stated above, I reevaluated the patient and found patient to be well-appearing in no distress Admission and further testing considered, no indications for admission or further work-up at this time.  Symptoms seem most likely to be peripheral nerve, no evidence of vascular involvement.  Recommended symptomatic treatment elevation and return instructions discussed.          Final Clinical Impression(s) / ED Diagnoses Final diagnoses:  Peroneal mononeuropathy, left    Rx / DC Orders ED Discharge Orders     None         Hayden Rasmussen,  MD 06/13/22 0929

## 2022-06-12 NOTE — Discharge Instructions (Addendum)
You were seen in the emergency department for some left leg discomfort.  You had an ultrasound that did not show any evidence of a blood clot and your lab work was unremarkable.  This may be related to some peripheral nerve injury from a prolonged car ride or the position you were sitting in today.  This will usually resolve with time. keep your leg elevated and rest.  Follow-up with your regular doctor.  Return to the emergency department if any worsening or concerning symptoms.

## 2022-06-12 NOTE — ED Triage Notes (Signed)
Patient here POV from Home.  Endorses Numbness to Left Leg from Knee to Toes. States it began approximately at 1200.   No Acute Trauma or Injury.   NAD noted during Triage. A&Ox4. GCS 15. Ambulatory.

## 2022-06-13 ENCOUNTER — Telehealth: Payer: Self-pay

## 2022-06-13 NOTE — Telephone Encounter (Signed)
Transition Care Management Follow-up Telephone Call Date of discharge and from where: 06/12/22 from [facility] How have you been since you were released from the hospital? Much better today Any questions or concerns? No  Items Reviewed: Did the pt receive and understand the discharge instructions provided? Yes  Medications obtained and verified?  No meds given from ED Other? No  Any new allergies since your discharge? No  Dietary orders reviewed? No Do you have support at home? Yes   Home Care and Equipment/Supplies: Were home health services ordered? not applicable If so, what is the name of the agency? N/a  Has the agency set up a time to come to the patient's home? not applicable Were any new equipment or medical supplies ordered?  No What is the name of the medical supply agency? N/a Were you able to get the supplies/equipment? not applicable Do you have any questions related to the use of the equipment or supplies? No  Functional Questionnaire: (I = Independent and D = Dependent) ADLs: I  Bathing/Dressing- I  Meal Prep- I  Eating- I  Maintaining continence- I  Transferring/Ambulation- I  Managing Meds- I  Follow up appointments reviewed:  PCP Hospital f/u appt confirmed?  No f/u needed   [facility] f/u appt confirmed? No   Are transportation arrangements needed? No  If their condition worsens, is the pt aware to call PCP or go to the Emergency Dept.? No Was the patient provided with contact information for the PCP's office or ED? Yes Was to pt encouraged to call back with questions or concerns? Yes

## 2022-07-14 LAB — HM MAMMOGRAPHY

## 2022-07-16 ENCOUNTER — Telehealth: Payer: Self-pay

## 2022-07-16 NOTE — Telephone Encounter (Signed)
LM for pt to return call to discuss.  

## 2022-07-16 NOTE — Telephone Encounter (Signed)
Please inform patient her mammogram is normal.  

## 2022-07-16 NOTE — Telephone Encounter (Signed)
Received Mammogram results from [facility] on 07/15/22. Will place on PCP desk for review.  Bi-rads-1

## 2022-07-17 NOTE — Telephone Encounter (Signed)
Pt advised of results. 

## 2022-09-30 ENCOUNTER — Encounter: Payer: Self-pay | Admitting: Family Medicine

## 2022-09-30 ENCOUNTER — Ambulatory Visit (INDEPENDENT_AMBULATORY_CARE_PROVIDER_SITE_OTHER): Admitting: Family Medicine

## 2022-09-30 VITALS — BP 117/82 | HR 75 | Temp 98.1°F | Ht 68.0 in | Wt 258.0 lb

## 2022-09-30 DIAGNOSIS — H6993 Unspecified Eustachian tube disorder, bilateral: Secondary | ICD-10-CM

## 2022-09-30 DIAGNOSIS — J0120 Acute ethmoidal sinusitis, unspecified: Secondary | ICD-10-CM

## 2022-09-30 MED ORDER — METHYLPREDNISOLONE ACETATE 80 MG/ML IJ SUSP
80.0000 mg | Freq: Once | INTRAMUSCULAR | Status: AC
  Administered 2022-09-30: 80 mg via INTRAMUSCULAR

## 2022-09-30 NOTE — Patient Instructions (Addendum)
Return if symptoms worsen or fail to improve.        Great to see you today.  I have refilled the medication(s) we provide.   If labs were collected, we will inform you of lab results once received either by echart message or telephone call.   - echart message- for normal results that have been seen by the patient already.   - telephone call: abnormal results or if patient has not viewed results in their echart.  Eustachian Tube Dysfunction  Eustachian tube dysfunction refers to a condition in which a blockage develops in the narrow passage that connects the middle ear to the back of the nose (eustachian tube). The eustachian tube regulates air pressure in the middle ear by letting air move between the ear and nose. It also helps to drain fluid from the middle ear space. Eustachian tube dysfunction can affect one or both ears. When the eustachian tube does not function properly, air pressure, fluid, or both can build up in the middle ear. What are the causes? This condition occurs when the eustachian tube becomes blocked or cannot open normally. Common causes of this condition include: Ear infections. Colds and other infections that affect the nose, mouth, and throat (upper respiratory tract). Allergies. Irritation from cigarette smoke. Irritation from stomach acid coming up into the esophagus (gastroesophageal reflux). The esophagus is the part of the body that moves food from the mouth to the stomach. Sudden changes in air pressure, such as from descending in an airplane or scuba diving. Abnormal growths in the nose or throat, such as: Growths that line the nose (nasal polyps). Abnormal growth of cells (tumors). Enlarged tissue at the back of the throat (adenoids). What increases the risk? You are more likely to develop this condition if: You smoke. You are overweight. You are a child who has: Certain birth defects of the mouth, such as cleft palate. Large tonsils or  adenoids. What are the signs or symptoms? Common symptoms of this condition include: A feeling of fullness in the ear. Ear pain. Clicking or popping noises in the ear. Ringing in the ear (tinnitus). Hearing loss. Loss of balance. Dizziness. Symptoms may get worse when the air pressure around you changes, such as when you travel to an area of high elevation, fly on an airplane, or go scuba diving. How is this diagnosed? This condition may be diagnosed based on: Your symptoms. A physical exam of your ears, nose, and throat. Tests, such as those that measure: The movement of your eardrum. Your hearing (audiometry). How is this treated? Treatment depends on the cause and severity of your condition. In mild cases, you may relieve your symptoms by moving air into your ears. This is called "popping the ears." In more severe cases, or if you have symptoms of fluid in your ears, treatment may include: Medicines to relieve congestion (decongestants). Medicines that treat allergies (antihistamines). Nasal sprays or ear drops that contain medicines that reduce swelling (steroids). A procedure to drain the fluid in your eardrum. In this procedure, a small tube may be placed in the eardrum to: Drain the fluid. Restore the air in the middle ear space. A procedure to insert a balloon device through the nose to inflate the opening of the eustachian tube (balloon dilation). Follow these instructions at home: Lifestyle Do not do any of the following until your health care provider approves: Travel to high altitudes. Fly in airplanes. Work in a Pension scheme manager or room. Scuba dive. Do not  use any products that contain nicotine or tobacco. These products include cigarettes, chewing tobacco, and vaping devices, such as e-cigarettes. If you need help quitting, ask your health care provider. Keep your ears dry. Wear fitted earplugs during showering and bathing. Dry your ears completely after. General  instructions Take over-the-counter and prescription medicines only as told by your health care provider. Use techniques to help pop your ears as recommended by your health care provider. These may include: Chewing gum. Yawning. Frequent, forceful swallowing. Closing your mouth, holding your nose closed, and gently blowing as if you are trying to blow air out of your nose. Keep all follow-up visits. This is important. Contact a health care provider if: Your symptoms do not go away after treatment. Your symptoms come back after treatment. You are unable to pop your ears. You have: A fever. Pain in your ear. Pain in your head or neck. Fluid draining from your ear. Your hearing suddenly changes. You become very dizzy. You lose your balance. Get help right away if: You have a sudden, severe increase in any of your symptoms. Summary Eustachian tube dysfunction refers to a condition in which a blockage develops in the eustachian tube. It can be caused by ear infections, allergies, inhaled irritants, or abnormal growths in the nose or throat. Symptoms may include ear pain or fullness, hearing loss, or ringing in the ears. Mild cases are treated with techniques to unblock the ears, such as yawning or chewing gum. More severe cases are treated with medicines or procedures. This information is not intended to replace advice given to you by your health care provider. Make sure you discuss any questions you have with your health care provider. Document Revised: 12/24/2020 Document Reviewed: 12/24/2020 Elsevier Patient Education  [place].

## 2022-09-30 NOTE — Progress Notes (Signed)
Stacy Solomon, Stacy Solomon 1970/12/19, 51 y.o., female MRN: [id number] Patient Care Team    Relationship Specialty Notifications Start End  Stacy Hillock, DO PCP - General Family Medicine  08/13/21   Stacy Solomon, [place] Referring Physician Optometry  08/13/21     Chief Complaint  Patient presents with   Ear pain    Bilateral, last week she went to urgent care ( Stacy Solomon)   Headache    Intense in the mornings, she takes goody powder for relief. Last use was yesterday     Subjective: Pt presents for an OV with complaints of bilateral ear pain of 1 week duration.  Associated symptoms include he was seen at urgent care and treated with Augmentin twice daily and prednisone 6-day taper. She reports headaches and ear pressure is still present.  She feels after the prednisone wore off her symptoms rebounded.  She denies fevers or chills.  She has mild left eye swelling.  She states that occurred prior to her illness after she had a facial, both of her eyes became swollen and her left eye was swollen shut.     08/13/2021   10:35 AM  Depression screen PHQ 2/9  Decreased Interest 0  Down, Depressed, Hopeless 0  PHQ - 2 Score 0    No Known Allergies Social History   Social History Narrative   Marital status/children/pets: Married   Engineer, materials:      -Wears a bicycle helmet riding a bike: Yes     -smoke alarm in the home:Yes     - wears seatbelt: Yes     - Feels safe in their relationships: Yes      Past Medical History:  Diagnosis Date   Anxiety state 11/13/2011   Formatting of this note might be different from the original. 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 STORY: offered to put her on sleep medicine but she declined offer    Leukocytosis 01/28/2016   Panic attacks    Sleep apnea    Vitamin D deficiency    Past Surgical History:  Procedure Laterality Date   BREAST REDUCTION SURGERY Bilateral    ENDOMETRIAL ABLATION  2008   LAPAROSCOPIC ABDOMINAL EXPLORATION     Family History  Problem Relation Age of Onset   Hypertension Mother    Hyperlipidemia Mother    Diabetes Mother    Arthritis Mother    Hypertension Maternal Grandmother    Diabetes Maternal Grandmother    Arthritis Maternal Grandmother    Diabetes Maternal Grandfather    Heart disease Maternal Grandfather    Hypertension Maternal Grandfather    Heart attack Maternal Grandfather    Breast cancer Paternal Grandmother    Colon cancer Neg Hx    Colon polyps Neg Hx    Esophageal cancer Neg Hx    Stomach cancer Neg Hx    Rectal cancer Neg Hx    Allergies as of 09/30/2022   No Known Allergies      Medication List        Accurate as of September 30, 2022  4:58 PM. If you have any questions, ask your nurse or doctor.          amoxicillin-clavulanate 875-125 MG tablet  Commonly known as: AUGMENTIN Take 1 tablet by mouth 2 (two) times daily.   buPROPion 150 MG 12 hr tablet Commonly known as: Wellbutrin SR Take 1 tablet (150 mg total) by mouth 2 (two) times daily.   LORazepam 0.5 MG tablet Commonly known as: ATIVAN 1-2 tabs po tid prn severe anxiety   naltrexone 50 MG tablet Commonly known as: DEPADE Take 0.5 tablets (25 mg total) by mouth in the morning and at bedtime.        All past medical history, surgical history, allergies, family history, immunizations andmedications were updated in the EMR today and reviewed under the history and medication portions of their EMR.     ROS Negative, with the exception of above mentioned in HPI   Objective:  BP 117/82   Pulse 75   Temp 98.1 F (36.7 C) (Oral)   Ht '5\' 8"'$  (1.727 m)   Wt 258 lb (117 kg)   SpO2 98%   BMI 39.23 kg/m  Body mass index is 39.23 kg/m. Physical  Exam Vitals and nursing note reviewed.  Constitutional:      General: She is not in acute distress.    Appearance: Normal appearance. She is not ill-appearing, toxic-appearing or diaphoretic.  HENT:     Head: Normocephalic and atraumatic.     Right Ear: Tympanic membrane and ear canal normal.     Left Ear: Tympanic membrane and ear canal normal.     Ears:     Comments: Mid ear effusion    Nose: No congestion or rhinorrhea.     Mouth/Throat:     Pharynx: No oropharyngeal exudate or posterior oropharyngeal erythema.  Eyes:     General: No scleral icterus.       Right eye: No discharge.        Left eye: No discharge.     Extraocular Movements: Extraocular movements intact.     Conjunctiva/sclera: Conjunctivae normal.     Pupils: Pupils are equal, round, and reactive to light.  Cardiovascular:     Rate and Rhythm: Normal rate and regular rhythm.  Pulmonary:     Effort: Pulmonary effort is normal. No respiratory distress.     Breath sounds: Normal breath sounds. No wheezing, rhonchi or rales.  Musculoskeletal:     Cervical back: Neck supple. No tenderness.  Lymphadenopathy:     Cervical: No cervical adenopathy.  Skin:    General: Skin is warm and dry.     Coloration: Skin is not jaundiced or pale.     Findings: No erythema or rash.  Neurological:     Mental Status: She is alert and oriented to person, place, and time. Mental status is at baseline.     Motor: No weakness.     Gait: Gait normal.  Psychiatric:        Mood and Affect: Mood normal.        Behavior: Behavior normal.        Thought Content: Thought content normal.        Judgment: Judgment normal.     No results found. No results found. No results found for this or any previous visit (from the past 24 hour(s)).  Assessment/Plan: Stacy Solomon is a 51 y.o. female present for OV for  Acute ethmoidal sinusitis, recurrence not specified/dysfunction eustachian tube From an infection standpoint, she seems  well treated and there is no evidence of infection today on exam She has eustachian tube dysfunction bilaterally and still has some mild swelling surrounding  her left eye (which may have been allergic rx from facial) - methylPREDNISolone acetate (DEPO-MEDROL) injection 80 mg Continue Flonase, use 1 spray each nare twice daily during illness, then can decrease down to 1 spray each nare until January/end of allergy season. Discussed emergent precautions concerning her left eye swelling.   Reviewed expectations re: course of current medical issues. Discussed self-management of symptoms. Outlined signs and symptoms indicating need for more acute intervention. Patient verbalized understanding and all questions were answered. Patient received an After-Visit Summary.    No orders of the defined types were placed in this encounter.  Meds ordered this encounter  Medications   methylPREDNISolone acetate (DEPO-MEDROL) injection 80 mg   Referral Orders  No referral(s) requested today     Note is dictated utilizing voice recognition software. Although note has been proof read prior to signing, occasional typographical errors still can be missed. If any questions arise, please do not hesitate to call for verification.   electronically signed by:  Howard Pouch, DO  [place]

## 2022-10-09 ENCOUNTER — Telehealth: Payer: Self-pay | Admitting: Family Medicine

## 2022-10-09 ENCOUNTER — Ambulatory Visit (INDEPENDENT_AMBULATORY_CARE_PROVIDER_SITE_OTHER): Admitting: Family Medicine

## 2022-10-09 ENCOUNTER — Encounter: Payer: Self-pay | Admitting: Family Medicine

## 2022-10-09 VITALS — BP 129/86 | HR 69 | Temp 97.6°F | Ht 68.0 in | Wt 256.0 lb

## 2022-10-09 DIAGNOSIS — H5789 Other specified disorders of eye and adnexa: Secondary | ICD-10-CM | POA: Diagnosis not present

## 2022-10-09 MED ORDER — KETOROLAC TROMETHAMINE 0.4 % OP SOLN: 1.0000 [drp] | Freq: Four times a day (QID) | OPHTHALMIC | 1 refills | Status: DC | PRN

## 2022-10-09 MED ORDER — DICLOFENAC SODIUM 0.1 % OP SOLN: 1.0000 [drp] | Freq: Four times a day (QID) | OPHTHALMIC | 1 refills | Status: DC

## 2022-10-09 NOTE — Progress Notes (Signed)
Stacy Solomon, Stacy Solomon 02/06/1971, 51 y.o., female MRN: [id number] Patient Care Team    Relationship Specialty Notifications Start End  Ma Hillock, DO PCP - General Family Medicine  08/13/21   Camillo Flaming, [place] Referring Physician Optometry  08/13/21     Chief Complaint  Patient presents with   Facial Swelling    Pt c/o b/l eye swelling, blurred vision,  intermittently x 2 weeks; worse episode yesterday morning     Subjective: Pt presents for an OV with complaints of eye swelling .  She was seen last week and had eye swelling at that time (please see note below for full details).  She states since that time intermittently will swell, then go down.  Today she states her eyes look fairly well.  She has dry scaly skin on the top of her eyelids from the swelling.  She reports intermittent blurred vision is occurring, more such as with bright lights.  She also reports she has identified another potential trigger for the condition that she did not think prior.  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   Prior note 09/30/2022  Pt presents for an OV with complaints of bilateral ear pain of 1 week duration.  Associated symptoms include he was seen at urgent care and treated with Augmentin twice daily and prednisone 6-day taper. She reports headaches and ear pressure is still present.  She feels after the prednisone wore off her symptoms rebounded.  She denies fevers or chills.  She has mild left eye swelling.  She states that occurred prior to her illness after she had a facial, both of her eyes became swollen and her left eye was swollen shut.    10/09/2022    8:04 AM 08/13/2021   10:35 AM  Depression screen PHQ 2/9  Decreased Interest 0 0  Down, Depressed, Hopeless 0 0  PHQ - 2 Score 0 0    No Known Allergies Social History   Social History Narrative   Marital status/children/pets: Married   Engineer, materials:      -Wears a  bicycle helmet riding a bike: Yes     -smoke alarm in the home:Yes     - wears seatbelt: Yes     - Feels safe in their relationships: Yes      Past Medical History:  Diagnosis Date   Anxiety state 11/13/2011   Formatting of this note might be different from the original. 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 STORY: offered to put her on sleep medicine but she declined offer   Leukocytosis 01/28/2016   Panic attacks    Sleep apnea    Vitamin D deficiency    Past Surgical History:  Procedure Laterality Date   BREAST REDUCTION SURGERY Bilateral    ENDOMETRIAL ABLATION  2008   LAPAROSCOPIC ABDOMINAL EXPLORATION     Family History  Problem Relation Age of Onset   Hypertension Mother    Hyperlipidemia Mother    Diabetes Mother    Arthritis Mother    Hypertension Maternal Grandmother    Diabetes Maternal Grandmother    Arthritis Maternal Grandmother    Diabetes Maternal Grandfather    Heart  disease Maternal Grandfather    Hypertension Maternal Grandfather    Heart attack Maternal Grandfather    Breast cancer Paternal Grandmother    Colon cancer Neg Hx    Colon polyps Neg Hx    Esophageal cancer Neg Hx    Stomach cancer Neg Hx    Rectal cancer Neg Hx    Allergies as of 10/09/2022   No Known Allergies      Medication List        Accurate as of October 09, 2022 10:13 AM. If you have any questions, ask your nurse or doctor.          STOP taking these medications    amoxicillin-clavulanate 875-125 MG tablet Commonly known as: AUGMENTIN Stopped by: Howard Pouch, DO   buPROPion 150 MG 12 hr tablet Commonly known as: Wellbutrin SR Stopped by: Howard Pouch, DO   LORazepam 0.5 MG tablet Commonly known as: ATIVAN Stopped by:  Howard Pouch, DO   naltrexone 50 MG tablet Commonly known as: DEPADE Stopped by: Howard Pouch, DO       TAKE these medications    diclofenac 0.1 % ophthalmic solution Commonly known as: VOLTAREN Place 1 drop into both eyes 4 (four) times daily. Started by: Howard Pouch, DO        All past medical history, surgical history, allergies, family history, immunizations andmedications were updated in the EMR today and reviewed under the history and medication portions of their EMR.     ROS Negative, with the exception of above mentioned in HPI   Objective:  BP 129/86   Pulse 69   Temp 97.6 F (36.4 C) (Oral)   Ht '5\' 8"'$  (1.727 m)   Wt 256 lb (116.1 kg)   SpO2 99%   BMI 38.92 kg/m  Body mass index is 38.92 kg/m. Physical Exam Vitals and nursing note reviewed.  Constitutional:      General: She is not in acute distress.    Appearance: Normal appearance. She is normal weight. She is not ill-appearing or toxic-appearing.  Eyes:     Extraocular Movements: Extraocular movements intact.     Right eye: Normal extraocular motion.     Left eye: Normal extraocular motion.     Conjunctiva/sclera: Conjunctivae normal.     Right eye: Right conjunctiva is not injected. No chemosis or exudate.    Left eye: Left conjunctiva is not injected. No chemosis or exudate.    Pupils: Pupils are equal, round, and reactive to light.     Comments: Mild swelling bilateral upper eyelids.  Neurological:     Mental Status: She is alert and oriented to person, place, and time. Mental status is at baseline.  Psychiatric:        Mood and Affect: Mood normal.        Behavior: Behavior normal.        Thought Content: Thought content normal.        Judgment: Judgment normal.     No results found. No results found. No results found for this or any previous visit (from the past 24 hour(s)).  Assessment/Plan: Stacy Solomon is a 51 y.o. female present for OV for  Eyes swelling/eye  irritation: Encouraged her to buy an over-the-counter eye rinse and rinse out her eyes a few times a day.   Avoid the possible trigger/chemical powder.  If needing to use wear goggles and ensure the room is well ventilated Collect eyedrops were called in for her, however [facility].  Therefore, diclofenac eyedrops were then called in for her with instruction to use 4 times daily for 1 week to help with current inflammation and irritation and then can use as needed only. Continue Zyrtec nightly Continue Flonase-ears have started to improve. We also discussed her being seen by her eye doctor just to ensure complete eye exam is normal and eye pressures are normal. Discussed emergent precautions concerning her left eye swelling.   Reviewed expectations re: course of current medical issues. Discussed self-management of symptoms. Outlined signs and symptoms indicating need for more acute intervention. Patient verbalized understanding and all questions were answered. Patient received an After-Visit Summary.    No orders of the defined types were placed in this encounter.  Meds ordered this encounter  Medications   DISCONTD: ketorolac (ACULAR) 0.4 % SOLN    Sig: Place 1 drop into both eyes 4 (four) times daily as needed.    Dispense:  5 mL    Refill:  1   diclofenac (VOLTAREN) 0.1 % ophthalmic solution    Sig: Place 1 drop into both eyes 4 (four) times daily.    Dispense:  5 mL    Refill:  1   Referral Orders  No referral(s) requested today     Note is dictated utilizing voice recognition software. Although note has been proof read prior to signing, occasional typographical errors still can be missed. If any questions arise, please do not hesitate to call for verification.   electronically signed by:  Howard Pouch, DO  [place]

## 2022-10-09 NOTE — Telephone Encounter (Signed)
Please advise 

## 2022-10-09 NOTE — Telephone Encounter (Signed)
Pt aware.

## 2022-10-09 NOTE — Telephone Encounter (Signed)
Called in alternative of diclofenac drops

## 2022-10-09 NOTE — Patient Instructions (Signed)
Return for cpe (40 min) with Pap.        Great to see you today.  I have refilled the medication(s) we provide.   If labs were collected, we will inform you of lab results once received either by echart message or telephone call.   - echart message- for normal results that have been seen by the patient already.   - telephone call: abnormal results or if patient has not viewed results in their echart.

## 2022-10-09 NOTE — Telephone Encounter (Signed)
Pt called and reports [facility] [place] [facility] the Ket[facility]ac in stock. She reports that [facility] looked [facility] no other local ones had it in stock, however she is asking that an alternative to Ketorolac be [facility] [place]. Please give the patient a call to discuss at   [phone]

## 2022-11-21 ENCOUNTER — Other Ambulatory Visit (HOSPITAL_COMMUNITY)
Admission: RE | Admit: 2022-11-21 | Discharge: 2022-11-21 | Disposition: A | Discharge status: Home / Self Care | Source: Ambulatory Visit | Attending: Family Medicine | Admitting: Family Medicine

## 2022-11-21 ENCOUNTER — Ambulatory Visit (INDEPENDENT_AMBULATORY_CARE_PROVIDER_SITE_OTHER): Admitting: Family Medicine

## 2022-11-21 ENCOUNTER — Encounter: Payer: Self-pay | Admitting: Family Medicine

## 2022-11-21 VITALS — BP 111/78 | HR 70 | Temp 97.7°F | Ht 67.75 in | Wt 245.0 lb

## 2022-11-21 DIAGNOSIS — Z0000 Encounter for general adult medical examination without abnormal findings: Secondary | ICD-10-CM

## 2022-11-21 DIAGNOSIS — E782 Mixed hyperlipidemia: Secondary | ICD-10-CM | POA: Diagnosis not present

## 2022-11-21 DIAGNOSIS — Z1231 Encounter for screening mammogram for malignant neoplasm of breast: Secondary | ICD-10-CM

## 2022-11-21 DIAGNOSIS — Z23 Encounter for immunization: Secondary | ICD-10-CM | POA: Diagnosis not present

## 2022-11-21 DIAGNOSIS — Z124 Encounter for screening for malignant neoplasm of cervix: Secondary | ICD-10-CM

## 2022-11-21 DIAGNOSIS — Z131 Encounter for screening for diabetes mellitus: Secondary | ICD-10-CM

## 2022-11-21 DIAGNOSIS — Z1211 Encounter for screening for malignant neoplasm of colon: Secondary | ICD-10-CM

## 2022-11-21 DIAGNOSIS — Z13220 Encounter for screening for lipoid disorders: Secondary | ICD-10-CM | POA: Diagnosis not present

## 2022-11-21 LAB — TSH: TSH: 1.75 u[IU]/mL (ref 0.35–5.50)

## 2022-11-21 LAB — LIPID PANEL
Cholesterol: 194 mg/dL (ref 0–200)
HDL: 55.2 mg/dL (ref >39.00)
LDL Cholesterol: 124 mg/dL — ABNORMAL HIGH (ref 0–99)
NonHDL: 138.63
Total CHOL/HDL Ratio: 4
Triglycerides: 75 mg/dL (ref 0.0–149.0)
VLDL: 15 mg/dL (ref 0.0–40.0)

## 2022-11-21 LAB — CBC
HCT: 42.9 % (ref 36.0–46.0)
Hemoglobin: 14.4 g/dL (ref 12.0–15.0)
MCHC: 33.5 g/dL (ref 30.0–36.0)
MCV: 89.5 fl (ref 78.0–100.0)
Platelets: 312 10*3/uL (ref 150.0–400.0)
RBC: 4.79 Mil/uL (ref 3.87–5.11)
RDW: 13.8 % (ref 11.5–15.5)
WBC: 6.1 10*3/uL (ref 4.0–10.5)

## 2022-11-21 LAB — COMPREHENSIVE METABOLIC PANEL
ALT: 23 U/L (ref 0–35)
AST: 19 U/L (ref 0–37)
Albumin: 4.3 g/dL (ref 3.5–5.2)
Alkaline Phosphatase: 71 U/L (ref 39–117)
BUN: 12 mg/dL (ref 6–23)
CO2: 29 mEq/L (ref 19–32)
Calcium: 9.3 mg/dL (ref 8.4–10.5)
Chloride: 103 mEq/L (ref 96–112)
Creatinine, Ser: 0.72 mg/dL (ref 0.40–1.20)
GFR: 95.76 mL/min (ref >60.00)
Glucose, Bld: 95 mg/dL (ref 70–99)
Potassium: 4.3 mEq/L (ref 3.5–5.1)
Sodium: 140 mEq/L (ref 135–145)
Total Bilirubin: 0.4 mg/dL (ref 0.2–1.2)
Total Protein: 7.1 g/dL (ref 6.0–8.3)

## 2022-11-21 LAB — HEMOGLOBIN A1C: Hgb A1c MFr Bld: 5.9 % (ref 4.6–6.5)

## 2022-11-21 NOTE — Progress Notes (Signed)
Patient ID: Stacy Solomon, female  DOB: 11-02-1970, 52 y.o.   MRN: [id number] Patient Care Team    Relationship Specialty Notifications Start End  Ma Hillock, DO PCP - General Family Medicine  08/13/21   Camillo Flaming, [place] Referring Physician Optometry  08/13/21     Chief Complaint  Patient presents with   Annual Exam    Pt is fasting    Gynecologic Exam    Subjective: Stacy Solomon is a 52 y.o.  female present for CPE with PAP All past medical history, surgical history, allergies, family history, immunizations, medications and social history were updated in the electronic medical record today. All recent labs, ED visits and hospitalizations within the last year were reviewed.  Health maintenance:  Colonoscopy: She reports she has never had a screen. No fhx. > referral placed last yr, but pt was ill on day of appt and canceled.  This year she is electing to have cologuard testing instead.  Mammogram: FHX GM. completed:07/14/2022> [facility]- ordered Cervical cancer screening: last pap: 05/13/2017, results: NL/- HPV, completed by: PCP> collected today Immunizations: tdap ?, Influenza declined (encouraged yearly), zostavax declined Infectious disease screening: HIV declined, Hep C agreeable to screen today DEXA:routine screen Assistive device: none Oxygen IFO:YDXA Patient has a Dental home. Hospitalizations/ED visits:reviewed     11/21/2022    9:48 AM 10/09/2022    8:04 AM 08/13/2021   10:35 AM  Depression screen PHQ 2/9  Decreased Interest 0 0 0  Down, Depressed, Hopeless 0 0 0  PHQ - 2 Score 0 0 0  Altered sleeping 0    Tired, decreased energy 0    Change in appetite 0    Feeling bad or failure about yourself  0    Trouble concentrating 0    Moving slowly or fidgety/restless 0    Suicidal thoughts 0    PHQ-9 Score 0        11/21/2022    9:48 AM  GAD 7 : Generalized Anxiety Score  Nervous, Anxious, on Edge 0  Control/stop worrying 0  Worry  too much - different things 0  Trouble relaxing 0  Restless 0  Easily annoyed or irritable 0  Afraid - awful might happen 0  Total GAD 7 Score 0            No data to display           Immunization History  Administered Date(s) Administered   Moderna Sars-Covid-2 Vaccination 01/16/2020, 02/12/2020   No results found.  Past Medical History:  Diagnosis Date   Anxiety state 11/13/2011   Formatting of this note might be different from the original. 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 STORY: offered to put her on sleep medicine but she declined offer   Leukocytosis 01/28/2016   Panic attacks    Sleep apnea    Vitamin D deficiency    No Known Allergies Past Surgical History:  Procedure Laterality Date   BREAST REDUCTION SURGERY Bilateral    ENDOMETRIAL ABLATION  2008   LAPAROSCOPIC ABDOMINAL EXPLORATION     Family History  Problem Relation Age of Onset   Hypertension Mother    Hyperlipidemia Mother    Diabetes Mother    Arthritis  Mother    Hypertension Maternal Grandmother    Diabetes Maternal Grandmother    Arthritis Maternal Grandmother    Diabetes Maternal Grandfather    Heart disease Maternal Grandfather    Hypertension Maternal Grandfather    Heart attack Maternal Grandfather    Breast cancer Paternal Grandmother    Colon cancer Neg Hx    Colon polyps Neg Hx    Esophageal cancer Neg Hx    Stomach cancer Neg Hx    Rectal cancer Neg Hx    Social History   Social History Narrative   Marital status/children/pets: Married   Engineer, materials:      -Wears a bicycle helmet riding a bike: Yes     -smoke alarm in the home:Yes     - wears seatbelt: Yes     - Feels safe in their relationships: Yes       Allergies as of 11/21/2022    No Known Allergies      Medication List        Accurate as of November 21, 2022 10:48 AM. If you have any questions, ask your nurse or doctor.          diclofenac 0.1 % ophthalmic solution Commonly known as: VOLTAREN Place 1 drop into both eyes 4 (four) times daily.        All past medical history, surgical history, allergies, family history, immunizations andmedications were updated in the EMR today and reviewed under the history and medication portions of their EMR.    No results found for this or any previous visit (from the past 2160 hour(s)).    ROS: 14 pt review of systems performed and negative (unless mentioned in an HPI)  Objective: BP 111/78   Pulse 70   Temp 97.7 F (36.5 C) (Oral)   Ht 5' 7.75" (1.721 m)   Wt 245 lb (111.1 kg)   SpO2 99%   BMI 37.53 kg/m  Physical Exam Vitals and nursing note reviewed.  Constitutional:      General: She is not in acute distress.    Appearance: Normal appearance. She is not ill-appearing or toxic-appearing.  HENT:     Head: Normocephalic and atraumatic.     Right Ear: Tympanic membrane, ear canal and external ear normal. There is no impacted cerumen.     Left Ear: Tympanic membrane, ear canal and external ear normal. There is no impacted cerumen.     Nose: No congestion or rhinorrhea.     Mouth/Throat:     Mouth: Mucous membranes are moist.     Pharynx: Oropharynx is clear. No oropharyngeal exudate or posterior oropharyngeal erythema.  Eyes:     General: No scleral icterus.       Right eye: No discharge.        Left eye: No discharge.     Extraocular Movements: Extraocular movements intact.     Conjunctiva/sclera: Conjunctivae normal.     Pupils: Pupils are equal, round, and reactive to light.  Cardiovascular:     Rate and Rhythm: Normal rate and regular rhythm.     Pulses: Normal pulses.     Heart sounds: Normal heart sounds. No murmur heard.    No friction rub. No gallop.  Pulmonary:     Effort: Pulmonary  effort is normal. No respiratory distress.     Breath sounds: Normal breath sounds. No stridor. No wheezing, rhonchi or rales.  Chest:     Chest wall: No tenderness.  Abdominal:     General: Abdomen  is flat. Bowel sounds are normal. There is no distension.     Palpations: Abdomen is soft. There is no mass.     Tenderness: There is no abdominal tenderness. There is no right CVA tenderness, left CVA tenderness, guarding or rebound.     Hernia: No hernia is present.  Musculoskeletal:        General: No swelling, tenderness or deformity. Normal range of motion.     Cervical back: Normal range of motion and neck supple. No rigidity or tenderness.     Right lower leg: No edema.     Left lower leg: No edema.  Lymphadenopathy:     Cervical: No cervical adenopathy.  Skin:    General: Skin is warm and dry.     Coloration: Skin is not jaundiced or pale.     Findings: No bruising, erythema, lesion or rash.  Neurological:     General: No focal deficit present.     Mental Status: She is alert and oriented to person, place, and time. Mental status is at baseline.     Cranial Nerves: No cranial nerve deficit.     Sensory: No sensory deficit.     Motor: No weakness.     Coordination: Coordination normal.     Gait: Gait normal.     Deep Tendon Reflexes: Reflexes normal.  Psychiatric:        Mood and Affect: Mood normal.        Behavior: Behavior normal.        Thought Content: Thought content normal.        Judgment: Judgment normal.   Breasts: breasts appear normal, symmetrical, no tenderness on exam, no suspicious masses, no skin or nipple changes or axillary nodes. GYN:  External genitalia within normal limits, normal hair distribution, no lesions. Urethral meatus normal, no lesions. Vaginal mucosa pink, moist, normal rugae, no lesions. No cystocele or rectocele. cervix without lesions, no discharge. Bimanual exam revealed normal uterus.  No bladder/suprapubic fullness, masses or tenderness. No  cervical motion tenderness. Mild discomfort with exam. No adnexal fullness. Anus and perineum within normal limits, no lesions.   Assessment/plan: AMIEL SHARROW is a 52 y.o. female present for CPE/PAP Mixed hyperlipidemia/Class 2 obesity  She has been able to control her cholesterol with diet.   Cbc, cmp, tsh, and lipids collected today   Routine general medical examination at a health care facility Patient was encouraged to exercise greater than 150 minutes a week. Patient was encouraged to choose a diet filled with fresh fruits and vegetables, and lean meats. AVS provided to patient today for education/recommendation on gender specific health and safety maintenance. Colonoscopy: She reports she has never had a screen. No fhx. > referral placed last yr, but pt was ill on day of appt and canceled.  This year she is electing to have cologuard testing instead.  Mammogram: FHX GM. completed:07/14/2022> [facility]- ordered Cervical cancer screening: last pap: 05/13/2017, results: NL/- HPV, completed by: PCP> collected today Immunizations: tdap ?, Influenza declined (encouraged yearly), zostavax declined Infectious disease screening: HIV declined, Hep C agreeable to screen today DEXA:routine screen  Return in about 1 year (around 11/23/2023) for cpe (20 min).  Orders Placed This Encounter  Procedures   MM 3D SCREEN BREAST BILATERAL   Hemoglobin A1c   Lipid panel   TSH   Cologuard   CBC   Comp Met (CMET)   No orders of the defined types were placed in this encounter.   Referral Orders  No referral(s) requested today      Note is dictated utilizing voice recognition software. Although note has been proof read prior to signing, occasional typographical errors still can be missed. If any questions arise, please do not hesitate to call for verification.  Electronically signed by: Howard Pouch, DO [place]

## 2022-11-21 NOTE — Patient Instructions (Addendum)
Return in about 1 year (around 11/23/2023) for cpe (20 min).        Great to see you today.  I have refilled the medication(s) we provide.   If labs were collected, we will inform you of lab results once received either by echart message or telephone call.   - echart message- for normal results that have been seen by the patient already.   - telephone call: abnormal results or if patient has not viewed results in their echart.

## 2022-11-28 LAB — CYTOLOGY - PAP
Comment: NEGATIVE
Diagnosis: NEGATIVE
Diagnosis: REACTIVE
High risk HPV: NEGATIVE

## 2022-12-08 ENCOUNTER — Ambulatory Visit

## 2023-07-22 LAB — HM MAMMOGRAPHY

## 2023-07-24 ENCOUNTER — Encounter: Payer: Self-pay | Admitting: Family Medicine

## 2023-07-31 ENCOUNTER — Other Ambulatory Visit: Payer: Self-pay | Admitting: Family Medicine

## 2023-07-31 DIAGNOSIS — Z1211 Encounter for screening for malignant neoplasm of colon: Secondary | ICD-10-CM

## 2023-07-31 DIAGNOSIS — Z1212 Encounter for screening for malignant neoplasm of rectum: Secondary | ICD-10-CM

## 2023-11-25 ENCOUNTER — Encounter: Admitting: Family Medicine

## 2024-04-17 ENCOUNTER — Emergency Department (HOSPITAL_BASED_OUTPATIENT_CLINIC_OR_DEPARTMENT_OTHER)
Admission: EM | Admit: 2024-04-17 | Discharge: 2024-04-18 | Disposition: A | Discharge status: Home / Self Care | Attending: Emergency Medicine | Admitting: Emergency Medicine

## 2024-04-17 ENCOUNTER — Other Ambulatory Visit: Payer: Self-pay

## 2024-04-17 DIAGNOSIS — R509 Fever, unspecified: Secondary | ICD-10-CM | POA: Diagnosis present

## 2024-04-17 DIAGNOSIS — J020 Streptococcal pharyngitis: Secondary | ICD-10-CM | POA: Diagnosis not present

## 2024-04-17 LAB — RESP PANEL BY RT-PCR (RSV, FLU A&B, COVID)  RVPGX2
Influenza A by PCR: NEGATIVE
Influenza B by PCR: NEGATIVE
Resp Syncytial Virus by PCR: NEGATIVE
SARS Coronavirus 2 by RT PCR: NEGATIVE

## 2024-04-17 MED ORDER — KETOROLAC TROMETHAMINE 15 MG/ML IJ SOLN
30.0000 mg | Freq: Once | INTRAMUSCULAR | Status: AC
  Administered 2024-04-18: 30 mg via INTRAVENOUS
  Filled 2024-04-17: qty 2

## 2024-04-17 MED ORDER — LACTATED RINGERS IV BOLUS
1000.0000 mL | Freq: Once | INTRAVENOUS | Status: AC
  Administered 2024-04-18: 1000 mL via INTRAVENOUS

## 2024-04-17 MED ORDER — DEXAMETHASONE SODIUM PHOSPHATE 10 MG/ML IJ SOLN
10.0000 mg | Freq: Once | INTRAMUSCULAR | Status: AC
  Administered 2024-04-18: 10 mg via INTRAVENOUS
  Filled 2024-04-17: qty 1

## 2024-04-17 NOTE — ED Notes (Signed)
 Pt family ask for another blanket, advised we can not provide another one until we can get her fever down.

## 2024-04-17 NOTE — ED Triage Notes (Signed)
 Pt POV reporting severe headache, congestion, and fatigue that began today. Fever 103 at home per pt, last tylenol  1700.

## 2024-04-17 NOTE — ED Notes (Signed)
 Pt advised she has a sore throat and was unsure if she advised the triage RN. Advised pt to inform the doctor when he comes in the room.

## 2024-04-17 NOTE — ED Provider Notes (Signed)
 [facility] EMERGENCY DEPARTMENT AT [facility] Provider Note   CSN: [id number] Arrival date & time: 04/17/24  2231     History No chief complaint on file.   HPI Stacy Solomon is a 53 y.o. female presenting for fever cough congestion. States that she has had some pharyngitis Fever of 103 this morning. Last medication at 5pm  Patient's recorded medical, surgical, social, medication list and allergies were reviewed in the Snapshot window as part of the initial history.   Review of Systems   Review of Systems  Constitutional:  Positive for fever. Negative for chills.  HENT:  Positive for sore throat. Negative for ear pain.   Eyes:  Negative for pain and visual disturbance.  Respiratory:  Positive for cough. Negative for shortness of breath.   Cardiovascular:  Negative for chest pain and palpitations.  Gastrointestinal:  Negative for abdominal pain and vomiting.  Genitourinary:  Negative for dysuria and hematuria.  Musculoskeletal:  Positive for myalgias. Negative for arthralgias and back pain.  Skin:  Negative for color change and rash.  Neurological:  Negative for seizures and syncope.  All other systems reviewed and are negative.   Physical Exam Updated Vital Signs BP 121/78   Pulse (!) 107   Temp 99.5 F (37.5 C) (Oral)   Resp 15   Ht 5' 8 (1.727 m)   Wt 101.6 kg   SpO2 92%   BMI 34.06 kg/m  Physical Exam Vitals and nursing note reviewed.  Constitutional:      General: She is not in acute distress.    Appearance: She is well-developed.  HENT:     Head: Normocephalic and atraumatic.   Eyes:     Conjunctiva/sclera: Conjunctivae normal.    Cardiovascular:     Rate and Rhythm: Normal rate and regular rhythm.     Heart sounds: No murmur heard. Pulmonary:     Effort: Pulmonary effort is normal. No respiratory distress.     Breath sounds: Normal breath sounds.  Abdominal:     General: There is no distension.     Palpations: Abdomen is  soft.     Tenderness: There is no abdominal tenderness. There is no right CVA tenderness or left CVA tenderness.   Musculoskeletal:        General: No swelling or tenderness. Normal range of motion.     Cervical back: Neck supple.   Skin:    General: Skin is warm and dry.   Neurological:     General: No focal deficit present.     Mental Status: She is alert and oriented to person, place, and time. Mental status is at baseline.     Cranial Nerves: No cranial nerve deficit.      ED Course/ Medical Decision Making/ A&P    Procedures Procedures   Medications Ordered in ED Medications - No data to display  Medical Decision Making:   Stacy Solomon is a 53 y.o. female who presented to the ED today with *** detailed above.    {crccomplexity:27900} Complete initial physical exam performed, notably the patient  was ***.    Reviewed and confirmed nursing documentation for past medical history, family history, social history.    Initial Assessment:   With the patient's presentation of ***, most likely diagnosis is ***. Other diagnoses were considered including (but not limited to) ***. These are considered less likely due to history of present illness and physical exam findings.   {crccopa:27899}  Initial Plan:  ***  ***Screening  labs including CBC and Metabolic panel to evaluate for infectious or metabolic etiology of disease.  ***Urinalysis with reflex culture ordered to evaluate for UTI or relevant urologic/nephrologic pathology.  ***CXR to evaluate for structural/infectious intrathoracic pathology.  {crccardiactesting:32591::EKG to evaluate for cardiac pathology} Objective evaluation as below reviewed   Initial Study Results:   Laboratory  All laboratory results reviewed without evidence of clinically relevant pathology.   ***Exceptions include: ***   ***EKG EKG was reviewed independently. Rate, rhythm, axis, intervals all examined and without medically relevant  abnormality. ST segments without concerns for elevations.    Radiology:  All images reviewed independently. ***Agree with radiology report at this time.   No results found.    Consults: Case discussed with ***.   Reassessment and Plan:   ***    ***  Clinical Impression: No diagnosis found.   Data Unavailable   Final Clinical Impression(s) / ED Diagnoses Final diagnoses:  None    Rx / DC Orders ED Discharge Orders     None

## 2024-04-18 LAB — BASIC METABOLIC PANEL WITH GFR
Anion gap: 11 (ref 5–15)
BUN: 9 mg/dL (ref 6–20)
CO2: 20 mmol/L — ABNORMAL LOW (ref 22–32)
Calcium: 8.8 mg/dL — ABNORMAL LOW (ref 8.9–10.3)
Chloride: 106 mmol/L (ref 98–111)
Creatinine, Ser: 0.75 mg/dL (ref 0.44–1.00)
GFR, Estimated: >60 mL/min (ref >60)
Glucose, Bld: 114 mg/dL — ABNORMAL HIGH (ref 70–99)
Potassium: 3.9 mmol/L (ref 3.5–5.1)
Sodium: 137 mmol/L (ref 135–145)

## 2024-04-18 LAB — CBC WITH DIFFERENTIAL/PLATELET
Abs Immature Granulocytes: 0.1 10*3/uL — ABNORMAL HIGH (ref 0.00–0.07)
Basophils Absolute: 0.1 10*3/uL (ref 0.0–0.1)
Basophils Relative: 0 %
Eosinophils Absolute: 0 10*3/uL (ref 0.0–0.5)
Eosinophils Relative: 0 %
HCT: 42.9 % (ref 36.0–46.0)
Hemoglobin: 14.4 g/dL (ref 12.0–15.0)
Immature Granulocytes: 1 %
Lymphocytes Relative: 6 %
Lymphs Abs: 1.1 10*3/uL (ref 0.7–4.0)
MCH: 30.1 pg (ref 26.0–34.0)
MCHC: 33.6 g/dL (ref 30.0–36.0)
MCV: 89.6 fL (ref 80.0–100.0)
Monocytes Absolute: 1.1 10*3/uL — ABNORMAL HIGH (ref 0.1–1.0)
Monocytes Relative: 6 %
Neutro Abs: 15.1 10*3/uL — ABNORMAL HIGH (ref 1.7–7.7)
Neutrophils Relative %: 87 %
Platelets: 263 10*3/uL (ref 150–400)
RBC: 4.79 MIL/uL (ref 3.87–5.11)
RDW: 13.6 % (ref 11.5–15.5)
WBC: 17.5 10*3/uL — ABNORMAL HIGH (ref 4.0–10.5)
nRBC: 0 % (ref 0.0–0.2)

## 2024-04-18 LAB — GROUP A STREP BY PCR: Group A Strep by PCR: DETECTED — AB

## 2024-04-18 MED ORDER — AMOXICILLIN 500 MG PO CAPS
500.0000 mg | ORAL_CAPSULE | Freq: Once | ORAL | Status: AC
  Administered 2024-04-18: 500 mg via ORAL
  Filled 2024-04-18: qty 1

## 2024-04-18 MED ORDER — AMOXICILLIN 500 MG PO CAPS: 500.0000 mg | ORAL_CAPSULE | Freq: Two times a day (BID) | ORAL | 0 refills | Status: DC

## 2024-04-18 NOTE — Transitions of Care (Post Inpatient/ED Visit) (Signed)
   04/18/2024  Name: Stacy Solomon MRN: [id number] DOB: 05-16-1971  Today's TOC FU Call Status: Today's TOC FU Call Status:: Successful TOC FU Call Completed TOC FU Call Complete Date: 04/18/24 Patient's Name and Date of Birth confirmed.  Transition Care Management Follow-up Telephone Call Date of Discharge: 04/17/24 Discharge Facility: [facility] ([facility]) Type of Discharge: Emergency Department Reason for ED Visit: Other: How have you been since you were released from the hospital?: Same Any questions or concerns?: No  Items Reviewed: Did you receive and understand the discharge instructions provided?: Yes Medications obtained,verified, and reconciled?: Yes (Medications Reviewed) Any new allergies since your discharge?: No Dietary orders reviewed?: NA Do you have support at home?: Yes People in Home [RPT]: spouse  Medications Reviewed Today: Medications Reviewed Today     Reviewed by Claudene Shanda ORN, CMA (Certified Medical Assistant) on 04/18/24 at 1423  Med List Status: <None>   Medication Order Taking? Sig Documenting Provider Last Dose Status Informant  amoxicillin  (AMOXIL ) 500 MG capsule [id number]  Take 1 capsule (500 mg total) by mouth 2 (two) times daily. Jerral Meth, MD  Active             Home Care and Equipment/Supplies: Were Home Health Services Ordered?: NA Any new equipment or medical supplies ordered?: NA  Functional Questionnaire: Do you need assistance with bathing/showering or dressing?: No Do you need assistance with meal preparation?: No Do you need assistance with eating?: No Do you have difficulty maintaining continence: No Do you need assistance with getting out of bed/getting out of a chair/moving?: No Do you have difficulty managing or taking your medications?: No  Follow up appointments reviewed: PCP Follow-up appointment confirmed?: Yes Date of PCP follow-up appointment?: 05/04/24 Follow-up Provider:  [facility] Follow-up appointment confirmed?: NA Do you need transportation to your follow-up appointment?: No Do you understand care options if your condition(s) worsen?: Yes-patient verbalized understanding    SIGNATURE Shanda Claudene

## 2024-05-04 ENCOUNTER — Encounter: Admitting: Family Medicine

## 2024-05-23 ENCOUNTER — Other Ambulatory Visit: Payer: Self-pay | Admitting: Medical Genetics

## 2024-05-25 ENCOUNTER — Encounter: Payer: Self-pay | Admitting: Family Medicine

## 2024-05-25 ENCOUNTER — Ambulatory Visit: Admitting: Family Medicine

## 2024-05-25 VITALS — BP 116/78 | HR 80 | Temp 98.3°F | Ht 67.3 in | Wt 228.2 lb

## 2024-05-25 DIAGNOSIS — E66812 Obesity, class 2: Secondary | ICD-10-CM | POA: Diagnosis not present

## 2024-05-25 DIAGNOSIS — E782 Mixed hyperlipidemia: Secondary | ICD-10-CM

## 2024-05-25 DIAGNOSIS — E039 Hypothyroidism, unspecified: Secondary | ICD-10-CM

## 2024-05-25 DIAGNOSIS — E559 Vitamin D deficiency, unspecified: Secondary | ICD-10-CM | POA: Diagnosis not present

## 2024-05-25 DIAGNOSIS — Z1211 Encounter for screening for malignant neoplasm of colon: Secondary | ICD-10-CM

## 2024-05-25 DIAGNOSIS — Z8249 Family history of ischemic heart disease and other diseases of the circulatory system: Secondary | ICD-10-CM

## 2024-05-25 DIAGNOSIS — E6609 Other obesity due to excess calories: Secondary | ICD-10-CM

## 2024-05-25 DIAGNOSIS — Z0000 Encounter for general adult medical examination without abnormal findings: Secondary | ICD-10-CM

## 2024-05-25 DIAGNOSIS — L659 Nonscarring hair loss, unspecified: Secondary | ICD-10-CM

## 2024-05-25 DIAGNOSIS — Z6838 Body mass index (BMI) 38.0-38.9, adult: Secondary | ICD-10-CM

## 2024-05-25 DIAGNOSIS — Z131 Encounter for screening for diabetes mellitus: Secondary | ICD-10-CM

## 2024-05-25 DIAGNOSIS — R490 Dysphonia: Secondary | ICD-10-CM

## 2024-05-25 DIAGNOSIS — Z793 Long term (current) use of hormonal contraceptives: Secondary | ICD-10-CM | POA: Insufficient documentation

## 2024-05-25 DIAGNOSIS — Z23 Encounter for immunization: Secondary | ICD-10-CM

## 2024-05-25 DIAGNOSIS — Z1231 Encounter for screening mammogram for malignant neoplasm of breast: Secondary | ICD-10-CM

## 2024-05-25 DIAGNOSIS — L989 Disorder of the skin and subcutaneous tissue, unspecified: Secondary | ICD-10-CM

## 2024-05-25 LAB — COMPREHENSIVE METABOLIC PANEL WITH GFR
ALT: 15 U/L (ref 0–35)
AST: 15 U/L (ref 0–37)
Albumin: 4.1 g/dL (ref 3.5–5.2)
Alkaline Phosphatase: 55 U/L (ref 39–117)
BUN: 13 mg/dL (ref 6–23)
CO2: 30 meq/L (ref 19–32)
Calcium: 8.9 mg/dL (ref 8.4–10.5)
Chloride: 105 meq/L (ref 96–112)
Creatinine, Ser: 0.67 mg/dL (ref 0.40–1.20)
GFR: 99.05 mL/min (ref >60.00)
Glucose, Bld: 81 mg/dL (ref 70–99)
Potassium: 4.1 meq/L (ref 3.5–5.1)
Sodium: 140 meq/L (ref 135–145)
Total Bilirubin: 0.5 mg/dL (ref 0.2–1.2)
Total Protein: 6.7 g/dL (ref 6.0–8.3)

## 2024-05-25 LAB — LIPID PANEL
Cholesterol: 202 mg/dL — ABNORMAL HIGH (ref 0–200)
HDL: 46.3 mg/dL (ref >39.00)
LDL Cholesterol: 138 mg/dL — ABNORMAL HIGH (ref 0–99)
NonHDL: 155.99
Total CHOL/HDL Ratio: 4
Triglycerides: 91 mg/dL (ref 0.0–149.0)
VLDL: 18.2 mg/dL (ref 0.0–40.0)

## 2024-05-25 LAB — CBC
HCT: 44.4 % (ref 36.0–46.0)
Hemoglobin: 14.4 g/dL (ref 12.0–15.0)
MCHC: 32.5 g/dL (ref 30.0–36.0)
MCV: 91.3 fl (ref 78.0–100.0)
Platelets: 294 K/uL (ref 150.0–400.0)
RBC: 4.86 Mil/uL (ref 3.87–5.11)
RDW: 13.7 % (ref 11.5–15.5)
WBC: 7.1 K/uL (ref 4.0–10.5)

## 2024-05-25 LAB — HEMOGLOBIN A1C: Hgb A1c MFr Bld: 5.7 % (ref 4.6–6.5)

## 2024-05-25 LAB — T3, FREE: T3, Free: 3.5 pg/mL (ref 2.3–4.2)

## 2024-05-25 LAB — T4, FREE: Free T4: 0.68 ng/dL (ref 0.60–1.60)

## 2024-05-25 LAB — TSH: TSH: 0.3 u[IU]/mL — ABNORMAL LOW (ref 0.35–5.50)

## 2024-05-25 NOTE — Patient Instructions (Addendum)

## 2024-05-25 NOTE — Progress Notes (Signed)
 Patient ID: Stacy Solomon, female  DOB: 26-Oct-1971, 53 y.o.   MRN: [id number] Patient Care Team    Relationship Specialty Notifications Start End  Stacy Solomon LABOR, DO PCP - General Family Medicine  08/13/21   Stacy Solomon, OD Referring Physician Optometry  08/13/21     Chief Complaint  Patient presents with   Annual Exam    Pt is fasting.     Subjective: Stacy Solomon is a 53 y.o.  female present for CPE and multiple acute concerns All past medical history, surgical history, allergies, family history, immunizations, medications and social history were updated in the electronic medical record today. All recent labs, ED visits and hospitalizations within the last year were reviewed.  Health maintenance:  Colon cancer screening: Patient has been referred to GI but establish, Cologuard was ordered last year patient has not completed.cologuard ordered again Breast cancer screening: FHX GM. completed 07/22/2023> [facility]- ordered Cervical cancer screening: last pap: 11/21/2022, results: NL/- HPV, completed by: PCP-repeat 5 years Immunizations: tdap (last 10 yrs), Influenza  (encouraged yearly), zostavax declined Infectious disease screening: HIV declined, Hep C completed DEXA:routine screen 60-65 recommended Assistive device: none Oxygen ldz:wnwz Patient has a Dental home. Hospitalizations/ED visits:reviewed   Patient reports she has a raised skin lesion on her left lower leg.  She feels it has been there for about a year and is wondering if it is something she should be of concern. Patient has questions surrounding the DNA testing that health is performing and wonders if she should forward with having that completed.  She does mention family concerns are that the family history of heart disease. She has noticed hair changes and wonders if the Nutrufol products would be recommended.   She reports she has been receiving hormone pellets through [facility]  since December.  She feels that it has helped with her energy level.  She reports they also diagnosed her with hypothyroidism: She states her T3 free was low.  Uncertain about TSH levels.  She was started on Cytomel 3 months ago and then levothyroxine 50 mcg 2 weeks ago.    She developed a raspy voice in since December.  She is a non-smoker.  She denies reflux or allergy symptoms, but states she did have a cold around that time and contributed to her cold, but it never has resolved.  Her husband and her friends have noticed the raspiness has increased.     05/25/2024   10:16 AM 05/25/2024    9:45 AM 11/21/2022    9:48 AM 10/09/2022    8:04 AM 08/13/2021   10:35 AM  Depression screen PHQ 2/9  Decreased Interest 0 0 0 0 0  Down, Depressed, Hopeless 0 0 0 0 0  PHQ - 2 Score 0 0 0 0 0  Altered sleeping 0  0    Tired, decreased energy 0  0    Change in appetite 0  0    Feeling bad or failure about yourself  0  0    Trouble concentrating 0  0    Moving slowly or fidgety/restless 0  0    Suicidal thoughts 0  0    PHQ-9 Score 0  0    Difficult doing work/chores Not difficult at all              05/25/2024   10:16 AM  Fall Risk   Falls in the past year? 0  Follow up Falls evaluation completed  Immunization History  Administered Date(s) Administered   Moderna Sars-Covid-2 Vaccination 01/16/2020, 02/12/2020   No results found.  Past Medical History:  Diagnosis Date   Anxiety state 11/13/2011   Formatting of this note might be different from the original. STORY: Will be flying to [place]  and requested something to have prior to flight   Chronic fatigue syndrome 02/04/2011   Disorder of synovium, tendon, and bursa 02/04/2011   Hypertension    Insomnia 02/04/2011   Formatting of this note might be different from the original. STORY: offered to put her on sleep medicine but she declined offer   Leukocytosis 01/28/2016   Palpitations 02/04/2011   Panic attacks    Sleep apnea     Vitamin D deficiency    No Known Allergies Past Surgical History:  Procedure Laterality Date   BREAST REDUCTION SURGERY Bilateral    ENDOMETRIAL ABLATION  2008   LAPAROSCOPIC ABDOMINAL EXPLORATION     Family History  Problem Relation Age of Onset   Hypertension Mother    Hyperlipidemia Mother    Diabetes Mother    Arthritis Mother    Hypertension Maternal Grandmother    Diabetes Maternal Grandmother    Arthritis Maternal Grandmother    Diabetes Maternal Grandfather    Heart disease Maternal Grandfather    Hypertension Maternal Grandfather    Heart attack Maternal Grandfather    Breast cancer Paternal Grandmother    Colon cancer Neg Hx    Colon polyps Neg Hx    Esophageal cancer Neg Hx    Stomach cancer Neg Hx    Rectal cancer Neg Hx    Social History   Social History Narrative   Marital status/children/pets: Married   Field seismologist:      -Wears a bicycle helmet riding a bike: Yes     -smoke alarm in the home:Yes     - wears seatbelt: Yes     - Feels safe in their relationships: Yes       Allergies as of 05/25/2024   No Known Allergies      Medication List        Accurate as of May 25, 2024  2:06 PM. If you have any questions, ask your nurse or doctor.          STOP taking these medications    amoxicillin  500 MG capsule Commonly known as: AMOXIL  Stopped by: Stacy Solomon   promethazine-dextromethorphan 6.25-15 MG/5ML syrup Commonly known as: PROMETHAZINE-DM Stopped by: Stacy Solomon   Wegovy 0.25 MG/0.5ML Soaj Generic drug: Semaglutide-Weight Management Stopped by: Stacy Solomon       TAKE these medications    levothyroxine 50 MCG tablet Commonly known as: SYNTHROID Take 50 mcg by mouth daily.   liothyronine 5 MCG tablet Commonly known as: CYTOMEL Take 15 mcg by mouth daily.   progesterone 100 MG capsule Commonly known as: PROMETRIUM Take 100 mg by mouth daily.        All past medical history, surgical history, allergies, family history,  immunizations andmedications were updated in the EMR today and reviewed under the history and medication portions of their EMR.        ROS: 14 pt review of systems performed and negative (unless mentioned in an HPI)  Objective: BP 116/78   Pulse 80   Temp 98.3 F (36.8 C)   Ht 5' 7.3 (1.709 m)   Wt 228 lb 3.2 oz (103.5 kg)   SpO2 99%   BMI 35.42 kg/m  Physical Exam Vitals and nursing note  reviewed.  Constitutional:      General: She is not in acute distress.    Appearance: Normal appearance. She is not ill-appearing or toxic-appearing.  HENT:     Head: Normocephalic and atraumatic.     Right Ear: Tympanic membrane, ear canal and external ear normal. There is no impacted cerumen.     Left Ear: Tympanic membrane, ear canal and external ear normal. There is no impacted cerumen.     Nose: No congestion or rhinorrhea.     Mouth/Throat:     Mouth: Mucous membranes are moist.     Pharynx: Oropharynx is clear. No oropharyngeal exudate or posterior oropharyngeal erythema.  Eyes:     General: No scleral icterus.       Right eye: No discharge.        Left eye: No discharge.     Extraocular Movements: Extraocular movements intact.     Conjunctiva/sclera: Conjunctivae normal.     Pupils: Pupils are equal, round, and reactive to light.  Cardiovascular:     Rate and Rhythm: Normal rate and regular rhythm.     Pulses: Normal pulses.     Heart sounds: Normal heart sounds. No murmur heard.    No friction rub. No gallop.  Pulmonary:     Effort: Pulmonary effort is normal. No respiratory distress.     Breath sounds: Normal breath sounds. No stridor. No wheezing, rhonchi or rales.  Chest:     Chest wall: No tenderness.  Abdominal:     General: Abdomen is flat. Bowel sounds are normal. There is no distension.     Palpations: Abdomen is soft. There is no mass.     Tenderness: There is no abdominal tenderness. There is no right CVA tenderness, left CVA tenderness, guarding or rebound.      Hernia: No hernia is present.  Musculoskeletal:        General: No swelling, tenderness or deformity. Normal range of motion.     Cervical back: Normal range of motion and neck supple. No rigidity or tenderness.     Right lower leg: No edema.     Left lower leg: No edema.  Lymphadenopathy:     Cervical: No cervical adenopathy.  Skin:    General: Skin is warm and dry.     Coloration: Skin is not jaundiced or pale.     Findings: Lesion (Raised flesh toned approximately 5 mm lesion left lateral lower leg) present. No bruising, erythema or rash.  Neurological:     General: No focal deficit present.     Mental Status: She is alert and oriented to person, place, and time. Mental status is at baseline.     Cranial Nerves: No cranial nerve deficit.     Sensory: No sensory deficit.     Motor: No weakness.     Coordination: Coordination normal.     Gait: Gait normal.     Deep Tendon Reflexes: Reflexes normal.  Psychiatric:        Mood and Affect: Mood normal.        Behavior: Behavior normal.        Thought Content: Thought content normal.        Judgment: Judgment normal.     Assessment/plan: Stacy Solomon is a 53 y.o. female present for CPE and multiple new concerns Mixed hyperlipidemia/Class 2 obesity/diabetes screening/family history of heart disease She has been able to control her cholesterol with diet.   Lipid and A1c collected today Discussed cardiac CT  with patient today and she is interested in coronary calcium score evaluation.  Orders placed.  Class 2 obesity due to excess calories without serious comorbidity with body mass index (BMI) of 38.0 to 38.9 in adult - Hemoglobin A1c - Lipid panel  Breast cancer screening by mammogram - MM 3D SCREENING MAMMOGRAM BILATERAL BREAST; Future Colon cancer screening - Cologuard  Need for Tdap vaccination She is going to check on the dates she had her Tdap.  She reports she has had 1 in the last 10 years Need for zoster  vaccination Declined Diabetes mellitus screening - Hemoglobin A1c  Hypothyroidism, unspecified type/long-term current use of hormonal contraception Diagnosed by [facility] Started on Cytomel 15 mcg daily 3 months ago and started on levothyroxine 50 mcg 2 weeks ago. She will work on tolerating a copy of her results from [facility] - T3, free - T4, free  Raspy voice Approximately 67-month onset without reflux or sinus symptoms. She has never smoked. We discussed referral to ENT for evaluation and she is agreeable to this today. - Ambulatory referral to ENT  Hair thinning Discussed hair thinning could be related to thyroid  disorder or certain vitamin deficiencies. The products she is inquiring about, is not FDA regulated therefore making recommendations on a product use is not ideal.  Skin lesion She is established with dermatology.  Encouraged her to make an appointment with her dermatologist to discuss the skin lesion and consider biopsy. Patient reports she will call make an appointment soon as possible  Routine general medical examination at a health care facility Colon cancer screening: Patient has been referred to GI but establish, Cologuard was ordered last year patient has not completed.cologuard ordered again Breast cancer screening: FHX GM. completed 07/22/2023> [facility]- ordered Cervical cancer screening: last pap: 11/21/2022, results: NL/- HPV, completed by: PCP-repeat 5 years Immunizations: tdap (last 10 yrs), Influenza  (encouraged yearly), zostavax declined Infectious disease screening: HIV declined, Hep C completed DEXA:routine screen 60-65 recommended Patient was encouraged to exercise greater than 150 minutes a week. Patient was encouraged to choose a diet filled with fresh fruits and vegetables, and lean meats. AVS provided to patient today for education/recommendation on gender specific health and safety maintenance. - CBC - Comprehensive metabolic  panel with GFR - TSH  Return in about 1 year (around 05/26/2025) for cpe (20 min).  Orders Placed This Encounter  Procedures   MM 3D SCREENING MAMMOGRAM BILATERAL BREAST   CT CARDIAC SCORING (SELF PAY ONLY)   CBC   Comprehensive metabolic panel with GFR   Hemoglobin A1c   Lipid panel   TSH   Cologuard   T3, free   T4, free   Ambulatory referral to ENT   No orders of the defined types were placed in this encounter.   Referral Orders         Ambulatory referral to ENT        Note is dictated utilizing voice recognition software. Although note has been proof read prior to signing, occasional typographical errors still can be missed. If any questions arise, please do not hesitate to call for verification.  Electronically signed by: Solomon Bellini, DO [facility]

## 2024-05-26 ENCOUNTER — Ambulatory Visit: Payer: Self-pay | Admitting: Family Medicine

## 2024-05-26 DIAGNOSIS — E079 Disorder of thyroid, unspecified: Secondary | ICD-10-CM

## 2024-05-26 NOTE — Telephone Encounter (Signed)
 Copied from CRM [id number]. Topic: General - Other >> May 26, 2024  2:51 PM Rosina BIRCH wrote: Reason for CRM: patient called stating she would like for the doctor to set up the referral for endocrine   CB [phone]

## 2024-06-03 NOTE — Telephone Encounter (Signed)
 Communication  Did the patient discuss referral with their provider in the last year? Yes    (If No - schedule appointment)    (If Yes - send message)        Appointment offered? Yes        Type of order/referral and detailed reason for visit: Endocrinologist        Preference of office, provider, location:        If referral order, have you been seen by this specialty before? No    (If Yes, this issue or another issue? When? Where?        Can we respond through MyChart? Yes    Please advise. Pt agreeable to endo referral.

## 2024-06-03 NOTE — Addendum Note (Signed)
 Addended by: Murial Beam A on: 06/03/2024 03:26 PM   Modules accepted: Orders

## 2024-06-23 ENCOUNTER — Ambulatory Visit (HOSPITAL_BASED_OUTPATIENT_CLINIC_OR_DEPARTMENT_OTHER)
Admission: RE | Admit: 2024-06-23 | Discharge: 2024-06-23 | Disposition: A | Discharge status: Home / Self Care | Payer: Self-pay | Source: Ambulatory Visit | Attending: Family Medicine | Admitting: Family Medicine

## 2024-06-23 DIAGNOSIS — Z8249 Family history of ischemic heart disease and other diseases of the circulatory system: Secondary | ICD-10-CM | POA: Insufficient documentation

## 2024-06-23 DIAGNOSIS — E782 Mixed hyperlipidemia: Secondary | ICD-10-CM | POA: Insufficient documentation

## 2024-07-06 ENCOUNTER — Ambulatory Visit (INDEPENDENT_AMBULATORY_CARE_PROVIDER_SITE_OTHER): Admitting: Family Medicine

## 2024-07-06 ENCOUNTER — Institutional Professional Consult (permissible substitution) (INDEPENDENT_AMBULATORY_CARE_PROVIDER_SITE_OTHER): Admitting: Otolaryngology

## 2024-07-06 ENCOUNTER — Encounter: Payer: Self-pay | Admitting: Family Medicine

## 2024-07-06 VITALS — BP 120/87 | HR 73 | Temp 97.9°F | Wt 230.0 lb

## 2024-07-06 DIAGNOSIS — J029 Acute pharyngitis, unspecified: Secondary | ICD-10-CM | POA: Diagnosis not present

## 2024-07-06 DIAGNOSIS — J00 Acute nasopharyngitis [common cold]: Secondary | ICD-10-CM

## 2024-07-06 LAB — POCT RAPID STREP A (OFFICE): Rapid Strep A Screen: NEGATIVE

## 2024-07-06 LAB — POC COVID19 BINAXNOW: SARS Coronavirus 2 Ag: NEGATIVE

## 2024-07-06 MED ORDER — IPRATROPIUM BROMIDE 0.06 % NA SOLN: 2.0000 | Freq: Four times a day (QID) | NASAL | 12 refills | Status: AC

## 2024-07-06 MED ORDER — METHYLPREDNISOLONE ACETATE 80 MG/ML IJ SUSP
80.0000 mg | Freq: Once | INTRAMUSCULAR | Status: AC
  Administered 2024-07-06: 80 mg via INTRAMUSCULAR

## 2024-07-06 MED ORDER — DOXYCYCLINE HYCLATE 100 MG PO TABS: 100.0000 mg | ORAL_TABLET | Freq: Two times a day (BID) | ORAL | 0 refills | Status: DC

## 2024-07-06 NOTE — Patient Instructions (Signed)
   Please remember to complete your cologuard.   No follow-ups on file.        Great to see you today.  I have refilled the medication(s) we provide.   If labs were collected or images ordered, we will inform you of  results once we have received them and reviewed. We will contact you either by echart message, or telephone call.  Please give ample time to the testing facility, and our office to run,  receive and review results. Please do not call inquiring of results, even if you can see them in your chart. We will contact you as soon as we are able. If it has been over 1 week since the test was completed, and you have not yet heard from us , then please call us .    - echart message- for normal results that have been seen by the patient already.   - telephone call: abnormal results or if patient has not viewed results in their echart.  If a referral to a specialist was entered for you, please call us  in 2 weeks if you have not heard from the specialist office to schedule.

## 2024-07-06 NOTE — Progress Notes (Signed)
 Stacy Solomon, Stacy Solomon Aug 20, 1971, 53 y.o., female MRN: [id number] Patient Care Team    Relationship Specialty Notifications Start End  Catherine Charlies LABOR, DO PCP - General Family Medicine  08/13/21   Lita Lye, OD Referring Physician Optometry  08/13/21     Chief Complaint  Patient presents with   Sore Throat    Started yesterday;  sore throat, congestion. Denies any other sx. Pt has tried tylenol  cold & flu, Nyquill.      Subjective: Stacy Solomon is a 53 y.o. Pt presents for an OV with complaints of sore throat of 2 days duration.  Associated symptoms include nasal congestion. Patient reports she just came home from a recent trip from [place]  and symptoms started the next morning.  She is not been exposed to any sick contacts that she is aware of.  She does have another trip coming this weekend. Pt has tried tylenol  cold and flu, nyquil to ease their symptoms.  She started Zyrtec last night. See ros     05/25/2024   10:16 AM 05/25/2024    9:45 AM 11/21/2022    9:48 AM 10/09/2022    8:04 AM 08/13/2021   10:35 AM  Depression screen PHQ 2/9  Decreased Interest 0 0 0 0 0  Down, Depressed, Hopeless 0 0 0 0 0  PHQ - 2 Score 0 0 0 0 0  Altered sleeping 0  0    Tired, decreased energy 0  0    Change in appetite 0  0    Feeling bad or failure about yourself  0  0    Trouble concentrating 0  0    Moving slowly or fidgety/restless 0  0    Suicidal thoughts 0  0    PHQ-9 Score 0  0    Difficult doing work/chores Not difficult at all        No Known Allergies Social History   Social History Narrative   Marital status/children/pets: Married   Field seismologist:      -Wears a bicycle helmet riding a bike: Yes     -smoke alarm in the home:Yes     - wears seatbelt: Yes     - Feels safe in their relationships: Yes      Past Medical History:  Diagnosis Date   Anxiety state 11/13/2011   Formatting of this note might be different from the original. 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 STORY: offered to put her on sleep medicine but she declined offer   Leukocytosis 01/28/2016   Palpitations 02/04/2011   Panic attacks    Sleep apnea    Vitamin D deficiency    Past Surgical History:  Procedure Laterality Date   BREAST REDUCTION SURGERY Bilateral    ENDOMETRIAL ABLATION  2008   LAPAROSCOPIC ABDOMINAL EXPLORATION     Family History  Problem Relation Age of Onset   Hypertension Mother    Hyperlipidemia Mother    Diabetes Mother    Arthritis Mother    Hypertension Maternal Grandmother    Diabetes Maternal Grandmother    Arthritis Maternal Grandmother    Diabetes Maternal Grandfather    Heart disease Maternal Grandfather    Hypertension Maternal Grandfather  Heart attack Maternal Grandfather    Breast cancer Paternal Grandmother    Colon cancer Neg Hx    Colon polyps Neg Hx    Esophageal cancer Neg Hx    Stomach cancer Neg Hx    Rectal cancer Neg Hx    Allergies as of 07/06/2024   No Known Allergies      Medication List        Accurate as of July 06, 2024  3:31 PM. If you have any questions, ask your nurse or doctor.          doxycycline  100 MG tablet Commonly known as: VIBRA -TABS Take 1 tablet (100 mg total) by mouth 2 (two) times daily. Started by: Charlies Bellini   ipratropium 0.06 % nasal spray Commonly known as: ATROVENT  Place 2 sprays into both nostrils 4 (four) times daily. Started by: Tedford Berg   levothyroxine 50 MCG tablet Commonly known as: SYNTHROID Take 50 mcg by mouth daily.   liothyronine 5 MCG tablet Commonly known as: CYTOMEL Take 15 mcg by mouth daily.   progesterone 100 MG capsule Commonly known as: PROMETRIUM Take 100 mg by mouth daily.        All  past medical history, surgical history, allergies, family history, immunizations andmedications were updated in the EMR today and reviewed under the history and medication portions of their EMR.     Review of Systems  Constitutional:  Negative for chills, fever and malaise/fatigue.  HENT:  Positive for congestion, ear pain and sore throat.   Eyes: Negative.   Respiratory:  Negative for cough, shortness of breath and wheezing.   Cardiovascular: Negative.   Gastrointestinal:  Negative for diarrhea, nausea and vomiting.  Genitourinary: Negative.   Musculoskeletal:  Negative for myalgias.  Neurological:  Negative for dizziness and headaches.   Negative, with the exception of above mentioned in HPI   Objective:  BP 120/87   Pulse 73   Temp 97.9 F (36.6 C)   Wt 230 lb (104.3 kg)   SpO2 100%   BMI 35.70 kg/m  Body mass index is 35.7 kg/m. Physical Exam Vitals and nursing note reviewed.  Constitutional:      General: She is not in acute distress.    Appearance: Normal appearance. She is normal weight. She is not ill-appearing or toxic-appearing.  HENT:     Head: Normocephalic and atraumatic.     Right Ear: Tympanic membrane and ear canal normal. No tenderness. Tympanic membrane is not erythematous.     Left Ear: Ear canal normal. No drainage, swelling or tenderness. A middle ear effusion is present. Tympanic membrane is not erythematous.     Nose: Congestion and rhinorrhea present.     Mouth/Throat:     Mouth: Mucous membranes are moist. No oral lesions.     Pharynx: Uvula midline. Posterior oropharyngeal erythema present. No pharyngeal swelling, oropharyngeal exudate or uvula swelling.     Tonsils: No tonsillar exudate.     Comments: Postnasal drip present Eyes:     General: No scleral icterus.       Right eye: No discharge.        Left eye: No discharge.     Extraocular Movements: Extraocular movements intact.     Conjunctiva/sclera: Conjunctivae normal.     Pupils: Pupils  are equal, round, and reactive to light.  Cardiovascular:     Rate and Rhythm: Normal rate and regular rhythm.  Pulmonary:     Effort: Pulmonary effort is normal. No respiratory distress.  Breath sounds: Normal breath sounds. No wheezing, rhonchi or rales.  Musculoskeletal:     Cervical back: Neck supple.  Lymphadenopathy:     Cervical: No cervical adenopathy.  Skin:    Findings: No rash.  Neurological:     Mental Status: She is alert and oriented to person, place, and time. Mental status is at baseline.     Motor: No weakness.     Coordination: Coordination normal.     Gait: Gait normal.  Psychiatric:        Mood and Affect: Mood normal.        Behavior: Behavior normal.        Thought Content: Thought content normal.        Judgment: Judgment normal.     No results found. No results found. Results for orders placed or performed in visit on 07/06/24 (from the past 24 hours)  POC COVID-19 BinaxNow     Status: Normal   Collection Time: 07/06/24 11:48 AM  Result Value Ref Range   SARS Coronavirus 2 Ag Negative Negative  POCT rapid strep A     Status: Normal   Collection Time: 07/06/24 11:48 AM  Result Value Ref Range   Rapid Strep A Screen Negative Negative    Assessment/Plan: Lizeth Bencosme Combrinck-Graham is a 53 y.o. female present for OV for  Sore throat (Primary)/rhinitis - POC COVID-19 BinaxNow-negative - POCT rapid strep A-negative - methylPREDNISolone  acetate (DEPO-MEDROL ) injection 80 mg Rest, hydrate Nasal saline flushes twice daily Atrovent  nasal spray every 6 hours Continue Zyrtec nightly-would recommend her to take this for the next 4 weeks since she is traveling in different climates. Discussed her symptoms are most likely rhinosinusitis caused by either changing climates and allergy exposure versus early viral illness.  Antibiotics would not be beneficial for her at this time.  Since she is traveling again towards the end of the week I did prescribe her an  antibiotic to take with her in the event her symptoms continue at that point or rebound after 7 days.  Patient reports understanding.  Reviewed expectations re: course of current medical issues. Discussed self-management of symptoms. Outlined signs and symptoms indicating need for more acute intervention. Patient verbalized understanding and all questions were answered. Patient received an After-Visit Summary.    Orders Placed This Encounter  Procedures   POC COVID-19 BinaxNow   POCT rapid strep A   Meds ordered this encounter  Medications   ipratropium (ATROVENT ) 0.06 % nasal spray    Sig: Place 2 sprays into both nostrils 4 (four) times daily.    Dispense:  15 mL    Refill:  12   doxycycline  (VIBRA -TABS) 100 MG tablet    Sig: Take 1 tablet (100 mg total) by mouth 2 (two) times daily.    Dispense:  20 tablet    Refill:  0   methylPREDNISolone  acetate (DEPO-MEDROL ) injection 80 mg   Referral Orders  No referral(s) requested today     Note is dictated utilizing voice recognition software. Although note has been proof read prior to signing, occasional typographical errors still can be missed. If any questions arise, please do not hesitate to call for verification.   electronically signed by:  Charlies Bellini, DO  [facility] [place]

## 2024-07-07 ENCOUNTER — Ambulatory Visit: Payer: Self-pay | Admitting: Family Medicine

## 2024-07-07 ENCOUNTER — Ambulatory Visit: Payer: Self-pay

## 2024-07-07 NOTE — Telephone Encounter (Signed)
 This RN made third attempt with no answer (second attempt made by Suzen, RN per pt chart). This RN left a voicemail with call back number provided.

## 2024-07-07 NOTE — Telephone Encounter (Signed)
 FYI Only or Action Required?: FYI only for provider.  Patient was last seen in primary care on 07/06/2024 by Catherine Fuller A, DO.  Called Nurse Triage reporting Sore Throat.  Symptoms began several days ago.  Interventions attempted: OTC medications: Allergy Meds .  Symptoms are: unchanged.  Triage Disposition: Home Care  Patient/caregiver understands and will follow disposition?: Yes   Message from [facility] B sent at 07/07/2024 10:06 AM EDT  Patient was seen in office yesterday and treated for allergies. She received a steroid injection and is taking zyrtec and nasal spray. Pt says she woke up this morning and was not feeling any better. Her throat is still sore and feels like she has cold in her throat. Pt is leaving to go out town tomorrow morning and would like something else called in if possible.   Reason for Disposition  Cough with cold symptoms (e.g., runny nose, postnasal drip, throat clearing)  Answer Assessment - Initial Assessment Questions Patient is requesting if anything can be sent to [facility] on file or any recommendations for any thing OTC she can take. Leaving to go out of town tomorrow and not feeling any relief from medications from OV yesterday. Patient requesting a phone call back [phone]   1. ONSET: When did the cough begin?      Last night  2. SEVERITY: How bad is the cough today?      Feels like trying to cough up something, throat is still sore and feels like coughing up what's draining in throat  3. SPUTUM: Describe the color of your sputum (e.g., none, dry cough; clear, white, yellow, green)     Coughed up a little yellow phlegm in shower this morning   4. OTHER SYMPTOMS: Do you have any other symptoms? (e.g., runny nose, wheezing, chest pain)       Denies fever  Protocols used: Cough - Acute Productive-A-AH

## 2024-07-07 NOTE — Telephone Encounter (Signed)
 This RN made first attempt to contact pt with no answer. This RN left a voicemail with call back number provided.     Copied from CRM #[id number]. Topic: Clinical - Pink Word Triage >> Jul 07, 2024 10:00 AM Dedra B wrote: Reason for Triage: Patient was seen in office yesterday and treated for allergies. She received a steroid injection and is taking zyrtec and nasal spray. Pt says she woke up this morning and was not feeling any better. Her throat is still sore and feels like she has cold in her throat. >> Jul 07, 2024 10:06 AM Dedra B wrote: Patient was seen in office yesterday and treated for allergies. She received a steroid injection and is taking zyrtec and nasal spray. Pt says she woke up this morning and was not feeling any better. Her throat is still sore and feels like she has cold in her throat. Pt is leaving to go out town tomorrow morning and would like something else called in if possible.

## 2024-07-07 NOTE — Telephone Encounter (Signed)
   Message from [facility] B sent at 07/07/2024 10:06 AM EDT  Patient was seen in office yesterday and treated for allergies. She received a steroid injection and is taking zyrtec and nasal spray. Pt says she woke up this morning and was not feeling any better. Her throat is still sore and feels like she has cold in her throat. Pt is leaving to go out town tomorrow morning and would like something else called in if possible.

## 2024-07-29 ENCOUNTER — Ambulatory Visit (HOSPITAL_BASED_OUTPATIENT_CLINIC_OR_DEPARTMENT_OTHER): Admitting: Radiology

## 2024-08-10 ENCOUNTER — Encounter (INDEPENDENT_AMBULATORY_CARE_PROVIDER_SITE_OTHER): Payer: Self-pay | Admitting: Otolaryngology

## 2024-08-10 ENCOUNTER — Ambulatory Visit (INDEPENDENT_AMBULATORY_CARE_PROVIDER_SITE_OTHER): Admitting: Otolaryngology

## 2024-08-10 VITALS — BP 135/91 | HR 75 | Temp 98.8°F | Ht 68.0 in | Wt 230.0 lb

## 2024-08-10 DIAGNOSIS — R490 Dysphonia: Secondary | ICD-10-CM | POA: Diagnosis not present

## 2024-08-10 DIAGNOSIS — R0982 Postnasal drip: Secondary | ICD-10-CM

## 2024-08-10 DIAGNOSIS — J3089 Other allergic rhinitis: Secondary | ICD-10-CM

## 2024-08-10 DIAGNOSIS — R0981 Nasal congestion: Secondary | ICD-10-CM | POA: Diagnosis not present

## 2024-08-10 DIAGNOSIS — K219 Gastro-esophageal reflux disease without esophagitis: Secondary | ICD-10-CM

## 2024-08-10 LAB — TSH: TSH: 0.25 — AB (ref 0.41–5.90)

## 2024-08-10 MED ORDER — LEVOCETIRIZINE DIHYDROCHLORIDE 5 MG PO TABS: 5.0000 mg | ORAL_TABLET | Freq: Every evening | ORAL | 3 refills | Status: DC

## 2024-08-10 MED ORDER — FLUTICASONE PROPIONATE 50 MCG/ACT NA SUSP
2.0000 | Freq: Two times a day (BID) | NASAL | 6 refills | Status: AC
Start: 2024-08-10 — End: ?

## 2024-08-10 NOTE — Patient Instructions (Signed)

## 2024-08-10 NOTE — Progress Notes (Signed)
 ENT CONSULT:  Reason for Consult: chronic hoarseness   HPI: Discussed the use of AI scribe software for clinical note transcription with the patient, who gave verbal consent to proceed.  History of Present Illness Stacy Solomon is a 53 year old female who presents with hoarseness x 9 months.  Hoarseness began approximately nine months ago, coinciding with the initiation of hormone replacement therapy for hot flashes. Initially mild, the hoarseness worsened after the second dose of therapy. Despite a reduction in the hormone dose, there has been no significant improvement, although the hoarseness is not as severe as it once was. It is most pronounced in the morning, affecting communication with her husband, who is hard of hearing. It becomes more noticeable to others when she speaks to people she hasn't seen in a while, as they often ask if she is sick.  She denies any history of postnasal drainage, allergies to dust or pollen, or symptoms of heartburn or reflux. She takes Zyrtec for allergies. There is no recollection of any cold or illness at the onset of symptoms.   Records Reviewed:  Office visit 07/06/24 Dr Catherine Ellouise LITTIE Qualia is a 53 y.o. Pt presents for an OV with complaints of sore throat of 2 days duration.  Associated symptoms include nasal congestion. Patient reports she just came home from a recent trip from [place]  and symptoms started the next morning.  She is not been exposed to any sick contacts that she is aware of.  She does have another trip coming this weekend. Pt has tried tylenol  cold and flu, nyquil to ease their symptoms.  She started Zyrtec last night. See ros    Past Medical History:  Diagnosis Date   Anxiety state 11/13/2011   Formatting of this note might be different from the original. STORY: Will be flying to [place]  and requested something to have prior to flight   Chronic fatigue syndrome 02/04/2011   Disorder of synovium, tendon, and  bursa 02/04/2011   Hypertension    Insomnia 02/04/2011   Formatting of this note might be different from the original. STORY: offered to put her on sleep medicine but she declined offer   Leukocytosis 01/28/2016   Palpitations 02/04/2011   Panic attacks    Sleep apnea    Vitamin D deficiency     Past Surgical History:  Procedure Laterality Date   BREAST REDUCTION SURGERY Bilateral    ENDOMETRIAL ABLATION  2008   LAPAROSCOPIC ABDOMINAL EXPLORATION      Family History  Problem Relation Age of Onset   Hypertension Mother    Hyperlipidemia Mother    Diabetes Mother    Arthritis Mother    Hypertension Maternal Grandmother    Diabetes Maternal Grandmother    Arthritis Maternal Grandmother    Diabetes Maternal Grandfather    Heart disease Maternal Grandfather    Hypertension Maternal Grandfather    Heart attack Maternal Grandfather    Breast cancer Paternal Grandmother    Colon cancer Neg Hx    Colon polyps Neg Hx    Esophageal cancer Neg Hx    Stomach cancer Neg Hx    Rectal cancer Neg Hx     Social History:  reports that she has never smoked. She has never been exposed to tobacco smoke. She has never used smokeless tobacco. She reports current alcohol use. She reports that she does not use drugs.  Allergies: No Known Allergies  Medications: I have reviewed the patient's current medications.  The PMH, PSH,  Medications, Allergies, and SH were reviewed and updated.  ROS: Constitutional: Negative for fever, weight loss and weight gain. Cardiovascular: Negative for chest pain and dyspnea on exertion. Respiratory: Is not experiencing shortness of breath at rest. Gastrointestinal: Negative for nausea and vomiting. Neurological: Negative for headaches. Psychiatric: The patient is not nervous/anxious  Blood pressure (!) 135/91, pulse 75, temperature 98.8 F (37.1 C), height 5' 8 (1.727 m), weight 230 lb (104.3 kg), SpO2 98%. Body mass index is 34.97 kg/m.  PHYSICAL  EXAM:  Exam: General: Well-developed, well-nourished Respiratory Respiratory effort: Equal inspiration and expiration without stridor Cardiovascular Peripheral Vascular: Warm extremities with equal color/perfusion Eyes: No nystagmus with equal extraocular motion bilaterally Neuro/Psych/Balance: Patient oriented to person, place, and time; Appropriate mood and affect; Gait is intact with no imbalance; Cranial nerves I-XII are intact Head and Face Inspection: Normocephalic and atraumatic without mass or lesion Palpation: Facial skeleton intact without bony stepoffs Salivary Glands: No mass or tenderness Facial Strength: Facial motility symmetric and full bilaterally ENT Pinna: External ear intact and fully developed External canal: Canal is patent with intact skin Tympanic Membrane: Clear and mobile External Nose: No scar or anatomic deformity Internal Nose: Septum is straight. No polyp, or purulence. Mucosal edema and erythema present.  Bilateral inferior turbinate hypertrophy.  Lips, Teeth, and gums: Mucosa and teeth intact and viable TMJ: No pain to palpation with full mobility Oral cavity/oropharynx: No erythema or exudate, no lesions present Nasopharynx: No mass or lesion with intact mucosa Hypopharynx: Intact mucosa without pooling of secretions Larynx Glottic: Full true vocal cord mobility without lesion or mass Supraglottic: Normal appearing epiglottis and AE folds Interarytenoid Space: Moderate pachydermia&edema Subglottic Space: Patent without lesion or edema Neck Neck and Trachea: Midline trachea without mass or lesion Thyroid : No mass or nodularity Lymphatics: No lymphadenopathy  Procedure:  Preoperative diagnosis: hoarseness  Postoperative diagnosis:   same  Procedure: Flexible fiberoptic laryngoscopy with stroboscopy (68420)   Surgeon: Issam Carlyon, MD  Anesthesia: Topical lidocaine and Afrin  Complications: None  Condition is stable throughout  exam  Indications and consent:   The patient presents to the clinic with hoarseness. All the risks, benefits, and potential complications were reviewed with the patient preoperatively and informed verbal consent was obtained.  Procedure: The patient was seated upright in the exam chair.   Topical lidocaine and Afrin were applied to the nasal cavity. After adequate anesthesia had occurred, the flexible telescope with strobe capabilities was passed into the nasal cavity. The nasopharynx was patent without mass or lesion. The scope was passed behind the soft palate and directed toward the base of tongue. The base of tongue was visualized and was symmetric with no apparent masses or abnormal appearing tissue. There were no signs of a mass or pooling of secretions in the piriform sinuses. The supraglottic structures were normal.  The true vocal cords are mobile. The medial edges were straight. Closure was complete. Periodicity present. The mucosal wave and amplitude were intact and normal. There is moderate to severe interarytenoid pachydermia and post cricoid edema. The mucosa appears without lesions.   The laryngoscope was then slowly withdrawn and the patient tolerated the procedure well. There were no complications or blood loss.    Studies Reviewed: CXR 04/22/2018 FINDINGS: Cardiac silhouette is normal in size and configuration. Normal mediastinal and hilar contours.   Clear lungs.  No pleural effusion or pneumothorax.   Skeletal structures are unremarkable.   IMPRESSION: No active cardiopulmonary disease.  Assessment/Plan: Encounter Diagnoses  Name Primary?  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 Vocal cords healthy with intact  mucosal wave and good closure during strobe exam, and she had changes c/w GERD LPR -  Reflux Gourmet after meals - diet and lifestyle changes to minimize GERD - Refer to [employer] blog for dietary and lifestyle modifications/reflux cook book  Chronic nasal congestion and allergic rhinitis Nasal congestion and post-nasal drainage, hx of allergy sx. Allergic rhinitis managed with Zyrtec. Possible postnasal drainage contributing to hoarseness. - Recommend trial of Flonase  for nasal congestion. - Continue Zyrtec daily - Consider saline nasal spray for additional relief, but not to be used concurrently with Flonase .      Thank you for allowing me to participate in the care of this patient. Please do not hesitate to contact me with any questions or concerns.   Elena Larry, MD Otolaryngology [facility] Phone: [phone] Fax: [phone]    08/10/2024, 2:13 PM

## 2024-08-22 ENCOUNTER — Other Ambulatory Visit: Payer: Self-pay | Admitting: Medical Genetics

## 2024-08-22 DIAGNOSIS — Z006 Encounter for examination for normal comparison and control in clinical research program: Secondary | ICD-10-CM

## 2024-09-08 NOTE — Patient Instructions (Signed)

## 2024-09-13 ENCOUNTER — Ambulatory Visit (INDEPENDENT_AMBULATORY_CARE_PROVIDER_SITE_OTHER): Payer: Self-pay | Admitting: Nurse Practitioner

## 2024-09-13 ENCOUNTER — Encounter: Payer: Self-pay | Admitting: Nurse Practitioner

## 2024-09-13 VITALS — BP 114/78 | HR 70 | Ht 68.0 in | Wt 235.6 lb

## 2024-09-13 DIAGNOSIS — Z6835 Body mass index (BMI) 35.0-35.9, adult: Secondary | ICD-10-CM

## 2024-09-13 DIAGNOSIS — Z789 Other specified health status: Secondary | ICD-10-CM

## 2024-09-13 DIAGNOSIS — E039 Hypothyroidism, unspecified: Secondary | ICD-10-CM | POA: Diagnosis not present

## 2024-09-13 DIAGNOSIS — E66812 Obesity, class 2: Secondary | ICD-10-CM | POA: Diagnosis not present

## 2024-09-13 NOTE — Progress Notes (Signed)
 Endocrinology Consult Note                                         09/13/2024, 11:27 AM  Subjective:   Subjective    Stacy Solomon is a 53 y.o.-year-old female patient being seen in consultation for hypothyroidism referred by Catherine Fuller A, DO.   Past Medical History:  Diagnosis Date   Anxiety state 11/13/2011   Formatting of this note might be different from the original. STORY: Will be flying to [place]  and requested something to have prior to flight   Chronic fatigue syndrome 02/04/2011   Disorder of synovium, tendon, and bursa 02/04/2011   Hypertension    Insomnia 02/04/2011   Formatting of this note might be different from the original. STORY: offered to put her on sleep medicine but she declined offer   Leukocytosis 01/28/2016   Palpitations 02/04/2011   Panic attacks    Sleep apnea    Vitamin D deficiency     Past Surgical History:  Procedure Laterality Date   BREAST REDUCTION SURGERY Bilateral    ENDOMETRIAL ABLATION  2008   LAPAROSCOPIC ABDOMINAL EXPLORATION      Social History   Socioeconomic History   Marital status: Married    Spouse name: Not on file   Number of children: Not on file   Years of education: Not on file   Highest education level: Not on file  Occupational History   Not on file  Tobacco Use   Smoking status: Never    Passive exposure: Never   Smokeless tobacco: Never  Vaping Use   Vaping status: Never Used  Substance and Sexual Activity   Alcohol use: Yes    Comment: socially   Drug use: No   Sexual activity: Yes    Partners: Male  Other Topics Concern   Not on file  Social History Narrative   Marital status/children/pets: Married   Safety:      -Wears a bicycle helmet riding a bike: Yes     -smoke alarm in the home:Yes     - wears seatbelt: Yes     - Feels safe in their relationships: Yes      Social Drivers of Corporate Investment Banker  Strain: Not on file  Food Insecurity: Not on file  Transportation Needs: Not on file  Physical Activity: Not on file  Stress: Not on file  Social Connections: Unknown (03/09/2022)   Received from [facility]   Social Network    Social Network: Not on file    Family History  Problem Relation Age of Onset   Hypertension Mother    Hyperlipidemia Mother    Diabetes Mother    Arthritis Mother    Hypertension Maternal Grandmother    Diabetes Maternal Grandmother    Arthritis Maternal Grandmother    Diabetes Maternal Grandfather    Heart disease Maternal Grandfather    Hypertension Maternal Grandfather    Heart attack Maternal Grandfather    Breast cancer Paternal  Grandmother    Colon cancer Neg Hx    Colon polyps Neg Hx    Esophageal cancer Neg Hx    Stomach cancer Neg Hx    Rectal cancer Neg Hx     Outpatient Encounter Medications as of 09/13/2024  Medication Sig   Estradiol 6 MG PLLT 10 mg by Implant route.   fluticasone  (FLONASE ) 50 MCG/ACT nasal spray Place 2 sprays into both nostrils 2 (two) times daily.   ipratropium (ATROVENT ) 0.06 % nasal spray Place 2 sprays into both nostrils 4 (four) times daily.   levothyroxine (SYNTHROID) 50 MCG tablet Take 50 mcg by mouth daily. (Patient taking differently: Take 25 mcg by mouth daily.)   liothyronine (CYTOMEL) 5 MCG tablet Take 15 mcg by mouth daily.   progesterone (PROMETRIUM) 100 MG capsule Take 100 mg by mouth daily.   SEMAGLUTIDE [place] Inject 15 mg into the skin once a week.   Testosterone 100 MG PLLT Inject 100 mg as directed See admin instructions.   Testosterone 37.5 MG PLLT Inject 37.5 mg as directed See admin instructions.   doxycycline  (VIBRA -TABS) 100 MG tablet Take 1 tablet (100 mg total) by mouth 2 (two) times daily. (Patient not taking: Reported on 09/13/2024)   No facility-administered encounter medications on file as of 09/13/2024.    ALLERGIES: No Known Allergies VACCINATION STATUS: Immunization History   Administered Date(s) Administered   Moderna Sars-Covid-2 Vaccination 01/16/2020, 02/12/2020     HPI   Stacy Solomon  is a patient with the above medical history. she was seen by Paris Harvest MD for assistance with weight management who also offered hormone replacement therapy about a year ago.  She has never officially been diagnosed with a thyroid  condition.  She is currently on Levothyroxine 50 micrograms daily and she is also on Cytomel 15 mcg daily started by [facility].  she reports compliance to this medication:  Taking it daily on empty stomach with water, separated by >30 minutes before breakfast and other medications, and by at least 4 hours from calcium, iron, PPIs, multivitamins.  She is also on compound semaglutide from [facility] MD as well.    I reviewed patient's thyroid  tests:  Lab Results  Component Value Date   TSH 0.25 (A) 08/10/2024   TSH 0.30 (L) 05/25/2024   TSH 1.75 11/21/2022   TSH 1.35 01/13/2022   TSH 1.14 08/13/2021   FREET4 0.68 05/25/2024   FREET4 0.75 01/13/2022   FREET4 0.83 08/13/2021     Pt describes: - weight gain- or inability to lose weight despite eating healthy and exercising - fatigue - irritability - lack of motivation  Pt denies feeling nodules in neck, hoarseness, dysphagia/odynophagia, SOB with lying down.  she denies family history of thyroid  disorders and denies family history of thyroid  cancer.  No history of radiation therapy to head or neck.  No recent use of iodine supplements.  Denies use of Biotin containing supplements.  She does have a daughter who was recently diagnosed with Lupus.  I reviewed her chart and she also has a history of HLD, HTN, vitamin D deficiency, OSA.    ROS:  Constitutional: + difficulty losing weight, + fatigue, no subjective hyperthermia, no subjective hypothermia Eyes: no blurry vision, no xerophthalmia ENT: no sore throat, no nodules palpated in throat, no dysphagia/odynophagia, +  hoarseness Cardiovascular: no chest pain, no SOB, no palpitations, no leg swelling Respiratory: no cough, no SOB Gastrointestinal: no nausea/vomiting/diarrhea Musculoskeletal: no muscle/joint aches Skin: no rashes Neurological: no tremors,  no numbness, no tingling, no dizziness Psychiatric: no depression, no anxiety, + irritability   Objective:   Objective     BP 114/78 (BP Location: Left Arm, Patient Position: Sitting, Cuff Size: Large)   Pulse 70   Ht 5' 8 (1.727 m)   Wt 235 lb 9.6 oz (106.9 kg)   BMI 35.82 kg/m  Wt Readings from Last 3 Encounters:  09/13/24 235 lb 9.6 oz (106.9 kg)  08/10/24 230 lb (104.3 kg)  07/06/24 230 lb (104.3 kg)    BP Readings from Last 3 Encounters:  09/13/24 114/78  08/10/24 (!) 135/91  07/06/24 120/87     Constitutional:  Body mass index is 35.82 kg/m., not in acute distress, normal state of mind Eyes: PERRLA, EOMI, no exophthalmos ENT: moist mucous membranes, no thyromegaly, no cervical lymphadenopathy Cardiovascular: normal precordial activity, RRR, no murmur/rubs/gallops Respiratory:  adequate breathing efforts, no gross chest deformity, Clear to auscultation bilaterally Gastrointestinal: abdomen soft, non-tender, no distension, bowel sounds present Musculoskeletal: no gross deformities, strength intact in all four extremities Skin: moist, warm, no rashes Neurological: + slight tremor with outstretched hands, deep tendon reflexes normal in BLE.   CMP ( most recent) CMP     Component Value Date/Time   NA 140 05/25/2024 0934   K 4.1 05/25/2024 0934   CL 105 05/25/2024 0934   CO2 30 05/25/2024 0934   GLUCOSE 81 05/25/2024 0934   BUN 13 05/25/2024 0934   CREATININE 0.67 05/25/2024 0934   CALCIUM 8.9 05/25/2024 0934   PROT 6.7 05/25/2024 0934   ALBUMIN 4.1 05/25/2024 0934   AST 15 05/25/2024 0934   ALT 15 05/25/2024 0934   ALKPHOS 55 05/25/2024 0934   BILITOT 0.5 05/25/2024 0934   GFR 99.05 05/25/2024 0934   GFRNONAA >60  04/18/2024 0003     Diabetic Labs (most recent): Lab Results  Component Value Date   HGBA1C 5.7 05/25/2024   HGBA1C 5.9 11/21/2022   HGBA1C 5.7 08/13/2021     Lipid Panel ( most recent) Lipid Panel     Component Value Date/Time   CHOL 202 (H) 05/25/2024 0934   TRIG 91.0 05/25/2024 0934   HDL 46.30 05/25/2024 0934   CHOLHDL 4 05/25/2024 0934   VLDL 18.2 05/25/2024 0934   LDLCALC 138 (H) 05/25/2024 0934        Assessment & Plan:   ASSESSMENT / PLAN:  1. Hypothyroidism-unspecified  On physical exam, patient does not have gross goiter, thyroid  nodules, or neck compression symptoms.  However, given her hoarseness, will plan for thyroid  ultrasound to assess baseline anatomy.  This may be beneficial since she has been on compound GLP1 hormone as well.  She is advised to continue Levothyroxine 50 mcg po daily before breakfast and Cytomel 15 mcg daily.  Will recheck TSH, Free T4 and Free T3 (and more comprehensive thyroid  antibody testing) and will reach out to patient with results and next steps.  - We discussed about correct intake of levothyroxine, at fasting, with water, separated by at least 30 minutes from breakfast, and separated by more than 4 hours from calcium, iron, multivitamins, acid reflux medications (PPIs). -Patient is made aware of the fact that thyroid  hormone replacement is needed for life, dose to be adjusted by periodic monitoring of thyroid  function tests.  2. Weight management Patient notes she tracks calories, eats cleanly and exercises optimally without positive results with weight loss.  She has been on compound Semaglutide weekly with [facility] but is unsure of her current dose.  She did stop this briefly and quickly put on 8 lbs that she had previously lost.  She notes she may be interested in changing to Zepbound.  She notes her insurance does not cover these medications but she is willing to do the self-pay option.  Will wait for her to communicate  current dosage of Semaglutide before initiating Zepbound so I can start her on the right dose.  The following Lifestyle Medicine recommendations according to American College of Lifestyle Medicine [facility]) were discussed and offered to patient and she agrees to start the journey:  A. Whole Foods, Plant-based plate comprising of fruits and vegetables, plant-based proteins, whole-grain carbohydrates was discussed in detail with the patient.   A list for source of those nutrients were also provided to the patient.  Patient will use only water or unsweetened tea for hydration. B.  The need to stay away from risky substances including alcohol, smoking; obtaining 7 to 9 hours of restorative sleep, at least 150 minutes of moderate intensity exercise weekly, the importance of healthy social connections,  and stress reduction techniques were discussed. C.  A full color page of  Calorie density of various food groups per pound showing examples of each food groups was provided to the patient.      - Time spent with the patient: 27 minutes,which was spent in obtaining information about her symptoms, reviewing her previous labs, evaluations, and treatments, counseling her about her hypothyroidism, and developing a plan to confirm the diagnosis and long term treatment as necessary. Please refer to Patient Self Inventory in the Media tab for reviewed elements of pertinent patient history.  Sylvan L Solomon participated in the discussions, expressed understanding, and voiced agreement with the above plans.  All questions were answered to her satisfaction. she is encouraged to contact clinic should she have any questions or concerns prior to her return visit.   FOLLOW UP PLAN:  Return for Thyroid  follow up, thyroid  ultrasound- will call with results and next steps.  Benton Rio, [facility] [address] [place], [place] [address] Phone: [phone] Fax:  [phone]  09/13/2024, 11:27 AM

## 2024-09-14 ENCOUNTER — Ambulatory Visit: Payer: Self-pay | Admitting: Nurse Practitioner

## 2024-09-14 DIAGNOSIS — E039 Hypothyroidism, unspecified: Secondary | ICD-10-CM

## 2024-09-14 LAB — T4, FREE: Free T4: 0.88 ng/dL (ref 0.82–1.77)

## 2024-09-14 LAB — THYROGLOBULIN ANTIBODY: Thyroglobulin Antibody: <1 [IU]/mL (ref 0.0–0.9)

## 2024-09-14 LAB — THYROTROPIN RECEPTOR AUTOABS: Thyrotropin Receptor Ab: <1.1 IU/L (ref 0.00–1.75)

## 2024-09-14 LAB — TSH: TSH: 0.853 u[IU]/mL (ref 0.450–4.500)

## 2024-09-14 LAB — T3, FREE: T3, Free: 2.8 pg/mL (ref 2.0–4.4)

## 2024-09-14 LAB — THYROID PEROXIDASE ANTIBODY: Thyroperoxidase Ab SerPl-aCnc: <9 [IU]/mL (ref 0–34)

## 2024-09-14 NOTE — Progress Notes (Signed)
 Noted

## 2024-09-14 NOTE — Progress Notes (Signed)
 FYI: I sent mychart message going over thyroid labs.

## 2024-09-15 ENCOUNTER — Other Ambulatory Visit: Payer: Self-pay | Admitting: Nurse Practitioner

## 2024-09-15 MED ORDER — TIRZEPATIDE-WEIGHT MANAGEMENT 2.5 MG/0.5ML ~~LOC~~ SOLN: 2.5000 mg | SUBCUTANEOUS | 1 refills | Status: DC

## 2024-09-15 NOTE — Addendum Note (Signed)
 Addended by: Rachana Malesky J on: 09/15/2024 08:45 AM   Modules accepted: Orders

## 2024-09-26 ENCOUNTER — Ambulatory Visit (HOSPITAL_COMMUNITY)
Admission: RE | Admit: 2024-09-26 | Discharge: 2024-09-26 | Disposition: A | Source: Ambulatory Visit | Attending: Nurse Practitioner

## 2024-09-26 DIAGNOSIS — E039 Hypothyroidism, unspecified: Secondary | ICD-10-CM | POA: Insufficient documentation

## 2024-09-27 ENCOUNTER — Other Ambulatory Visit: Payer: Self-pay | Admitting: Nurse Practitioner

## 2024-09-27 MED ORDER — LIOTHYRONINE SODIUM 5 MCG PO TABS: 15.0000 ug | ORAL_TABLET | Freq: Every day | ORAL | 1 refills | Status: AC

## 2024-09-27 MED ORDER — LEVOTHYROXINE SODIUM 50 MCG PO TABS: 50.0000 ug | ORAL_TABLET | Freq: Every day | ORAL | 1 refills | Status: AC

## 2024-09-27 NOTE — Progress Notes (Signed)
 Please schedule follow up in office in 3 months with previsit labs.

## 2024-09-27 NOTE — Addendum Note (Signed)
 Addended by: Javontay Vandam J on: 09/27/2024 09:02 AM   Modules accepted: Orders

## 2024-10-07 ENCOUNTER — Inpatient Hospital Stay (HOSPITAL_BASED_OUTPATIENT_CLINIC_OR_DEPARTMENT_OTHER): Admission: RE | Admit: 2024-10-07 | Source: Ambulatory Visit | Admitting: Radiology

## 2024-10-11 ENCOUNTER — Encounter (HOSPITAL_BASED_OUTPATIENT_CLINIC_OR_DEPARTMENT_OTHER): Payer: Self-pay | Admitting: Radiology

## 2024-10-11 ENCOUNTER — Inpatient Hospital Stay (HOSPITAL_BASED_OUTPATIENT_CLINIC_OR_DEPARTMENT_OTHER): Admission: RE | Admit: 2024-10-11 | Discharge: 2024-10-11 | Attending: Family Medicine | Admitting: Family Medicine

## 2024-10-11 DIAGNOSIS — Z1231 Encounter for screening mammogram for malignant neoplasm of breast: Secondary | ICD-10-CM | POA: Diagnosis present

## 2024-10-17 MED ORDER — TIRZEPATIDE-WEIGHT MANAGEMENT 5 MG/0.5ML ~~LOC~~ SOLN: 5.0000 mg | SUBCUTANEOUS | 1 refills | Status: DC

## 2024-10-17 NOTE — Addendum Note (Signed)
 Addended by: Shaylin Blatt J on: 10/17/2024 07:55 AM   Modules accepted: Orders

## 2024-10-17 NOTE — Telephone Encounter (Signed)
 What is this phone note asking for?  Is confusing if asking for an endocrine referral, when she saw an endocrinologist on 09/13/2024.

## 2024-11-09 LAB — LAB REPORT - SCANNED
Free T4: 0.83 ng/dL
TSH: 0.38 — AB (ref 0.41–5.90)

## 2024-11-10 ENCOUNTER — Encounter: Payer: Self-pay | Admitting: Nurse Practitioner

## 2024-11-15 MED ORDER — TIRZEPATIDE-WEIGHT MANAGEMENT 7.5 MG/0.5ML ~~LOC~~ SOLN: 7.5000 mg | SUBCUTANEOUS | 1 refills | Status: AC

## 2024-11-23 ENCOUNTER — Encounter: Payer: Self-pay | Admitting: Family Medicine

## 2024-11-23 ENCOUNTER — Ambulatory Visit: Payer: Self-pay | Admitting: Family Medicine

## 2024-11-23 ENCOUNTER — Ambulatory Visit (HOSPITAL_BASED_OUTPATIENT_CLINIC_OR_DEPARTMENT_OTHER)
Admission: RE | Admit: 2024-11-23 | Discharge: 2024-11-23 | Disposition: A | Discharge status: Home / Self Care | Source: Ambulatory Visit | Attending: Family Medicine | Admitting: Family Medicine

## 2024-11-23 ENCOUNTER — Ambulatory Visit: Admitting: Family Medicine

## 2024-11-23 VITALS — BP 124/78 | HR 67 | Temp 98.2°F | Wt 240.0 lb

## 2024-11-23 DIAGNOSIS — R5383 Other fatigue: Secondary | ICD-10-CM | POA: Diagnosis not present

## 2024-11-23 DIAGNOSIS — R079 Chest pain, unspecified: Secondary | ICD-10-CM | POA: Insufficient documentation

## 2024-11-23 DIAGNOSIS — Z8249 Family history of ischemic heart disease and other diseases of the circulatory system: Secondary | ICD-10-CM

## 2024-11-23 DIAGNOSIS — R0789 Other chest pain: Secondary | ICD-10-CM

## 2024-11-23 DIAGNOSIS — F419 Anxiety disorder, unspecified: Secondary | ICD-10-CM | POA: Diagnosis not present

## 2024-11-23 DIAGNOSIS — Z79890 Hormone replacement therapy (postmenopausal): Secondary | ICD-10-CM | POA: Insufficient documentation

## 2024-11-23 DIAGNOSIS — R011 Cardiac murmur, unspecified: Secondary | ICD-10-CM | POA: Diagnosis not present

## 2024-11-23 LAB — COMPREHENSIVE METABOLIC PANEL WITH GFR
ALT: 20 U/L (ref 3–35)
AST: 15 U/L (ref 5–37)
Albumin: 4 g/dL (ref 3.5–5.2)
Alkaline Phosphatase: 45 U/L (ref 39–117)
BUN: 12 mg/dL (ref 6–23)
CO2: 30 meq/L (ref 19–32)
Calcium: 9 mg/dL (ref 8.4–10.5)
Chloride: 105 meq/L (ref 96–112)
Creatinine, Ser: 0.7 mg/dL (ref 0.40–1.20)
GFR: 97.66 mL/min
Glucose, Bld: 83 mg/dL (ref 70–99)
Potassium: 4.6 meq/L (ref 3.5–5.1)
Sodium: 139 meq/L (ref 135–145)
Total Bilirubin: 0.5 mg/dL (ref 0.2–1.2)
Total Protein: 6.7 g/dL (ref 6.0–8.3)

## 2024-11-23 LAB — CBC WITH DIFFERENTIAL/PLATELET
Basophils Absolute: 0 10*3/uL (ref 0.0–0.1)
Basophils Relative: 0.4 % (ref 0.0–3.0)
Eosinophils Absolute: 0.1 10*3/uL (ref 0.0–0.7)
Eosinophils Relative: 0.7 % (ref 0.0–5.0)
HCT: 44.9 % (ref 36.0–46.0)
Hemoglobin: 14.9 g/dL (ref 12.0–15.0)
Lymphocytes Relative: 22.5 % (ref 12.0–46.0)
Lymphs Abs: 2.2 10*3/uL (ref 0.7–4.0)
MCHC: 33.2 g/dL (ref 30.0–36.0)
MCV: 90.9 fl (ref 78.0–100.0)
Monocytes Absolute: 0.7 10*3/uL (ref 0.1–1.0)
Monocytes Relative: 7.7 % (ref 3.0–12.0)
Neutro Abs: 6.6 10*3/uL (ref 1.4–7.7)
Neutrophils Relative %: 68.7 % (ref 43.0–77.0)
Platelets: 285 10*3/uL (ref 150.0–400.0)
RBC: 4.94 Mil/uL (ref 3.87–5.11)
RDW: 13.4 % (ref 11.5–15.5)
WBC: 9.6 10*3/uL (ref 4.0–10.5)

## 2024-11-23 LAB — LIPASE: Lipase: 25 U/L (ref 11.0–59.0)

## 2024-11-23 NOTE — Progress Notes (Signed)
 "      Stacy Solomon, Stacy Solomon 1971/09/30, 54 y.o., female MRN: [id number] Patient Care Team    Relationship Specialty Notifications Start End  Stacy Charlies LABOR, DO PCP - General Family Medicine  08/13/21   Lita Lye, OD Referring Physician Optometry  08/13/21   Therisa Benton PARAS, NP Nurse Practitioner Endocrinology  11/23/24     Chief Complaint  Patient presents with   Chest Pain    5 days; R sided discomfort. Denies injury/cause of discomfort.      Subjective: Stacy Solomon is a 54 y.o. Pt presents for an OV with complaints of Right sided chest discomfort of 5 days duration.   She denies injury or change in activity level.  She reports 3 occurrences over the last 5 days of localized sharp chest pain right upper chest.  She states the worst occurred when she was getting out of her shower, pain occurred and lasted for approximately 10 minutes and self resolved when she sat down to rest.  She is prescribed hormone therapy (estradiol, progesterone, test pellet)) by [facility]. She is also prescribed zepbound  7.5 weekly injection,  through endocrinology.  She denies right upper quadrant pain, nausea, vomiting or referred pain to her shoulder.   Cytomel  has been increased to 15 mcg daily by endocrine.  Recent labs 11/09/2024 with normal T3 free and T4 free, with TSH very mildly lower than normal.  Denies palpitations but has been feeling more anxious. Fatigue has been significant for her over the last year, without great resolution despite hormone therapy, T4 and T3 replacement. Patient denies chest pain, shortness of breath, dizziness or lower extremity edema or swelling.  No personal or family history of blood clots.  No recent travel or illness.      11/23/2024   11:41 AM 05/25/2024   10:16 AM 05/25/2024    9:45 AM 11/21/2022    9:48 AM 10/09/2022    8:04 AM  Depression screen PHQ 2/9  Decreased Interest 0 0 0 0 0  Down, Depressed, Hopeless 0 0 0 0 0  PHQ - 2 Score 0 0 0  0 0  Altered sleeping 0 0  0   Tired, decreased energy 0 0  0   Change in appetite 0 0  0   Feeling bad or failure about yourself  0 0  0   Trouble concentrating 0 0  0   Moving slowly or fidgety/restless 0 0  0   Suicidal thoughts 0 0  0   PHQ-9 Score 0 0   0    Difficult doing work/chores Not difficult at all Not difficult at all        Data saved with a previous flowsheet row definition      06/12/2022    7:43 PM 05/25/2024   10:16 AM 11/23/2024   11:41 AM  Fall Risk  Falls in the past year?  0 0  Was there an injury with Fall?   0  Fall Risk Category Calculator   0  (RETIRED) Patient Fall Risk Level Low fall risk     Patient at Risk for Falls Due to   No Fall Risks  Fall risk Follow up  Falls evaluation completed Falls evaluation completed     Data saved with a previous flowsheet row definition     Allergies[1] Social History   Social History Narrative   Marital status/children/pets: Married   Field Seismologist:      -Wears a bicycle helmet riding a  bike: Yes     -smoke alarm in the home:Yes     - wears seatbelt: Yes     - Feels safe in their relationships: Yes      Past Medical History:  Diagnosis Date   Anxiety state 11/13/2011   Formatting of this note might be different from the original. STORY: Will be flying to [place]  and requested something to have prior to flight   Chronic fatigue syndrome 02/04/2011   Disorder of synovium, tendon, and bursa 02/04/2011   Hypertension    Insomnia 02/04/2011   Formatting of this note might be different from the original. STORY: offered to put her on sleep medicine but she declined offer   Leukocytosis 01/28/2016   Palpitations 02/04/2011   Panic attacks    Sleep apnea    Vitamin D deficiency    Past Surgical History:  Procedure Laterality Date   BREAST REDUCTION SURGERY Bilateral    ENDOMETRIAL ABLATION  2008   LAPAROSCOPIC ABDOMINAL EXPLORATION     Family History  Problem Relation Age of Onset   Hypertension Mother     Hyperlipidemia Mother    Diabetes Mother    Arthritis Mother    Hypertension Maternal Grandmother    Diabetes Maternal Grandmother    Arthritis Maternal Grandmother    Diabetes Maternal Grandfather    Heart disease Maternal Grandfather    Hypertension Maternal Grandfather    Heart attack Maternal Grandfather    Breast cancer Paternal Grandmother    Colon cancer Neg Hx    Colon polyps Neg Hx    Esophageal cancer Neg Hx    Stomach cancer Neg Hx    Rectal cancer Neg Hx    Allergies as of 11/23/2024   No Known Allergies      Medication List        Accurate as of November 23, 2024 12:28 PM. If you have any questions, ask your nurse or doctor.          STOP taking these medications    doxycycline  100 MG tablet Commonly known as: VIBRA -TABS Stopped by: Charlies Bellini, DO       TAKE these medications    Estradiol 6 MG Pllt 10 mg by Implant route.   fluticasone  50 MCG/ACT nasal spray Commonly known as: FLONASE  Place 2 sprays into both nostrils 2 (two) times daily.   ipratropium 0.06 % nasal spray Commonly known as: ATROVENT  Place 2 sprays into both nostrils 4 (four) times daily.   levothyroxine  50 MCG tablet Commonly known as: SYNTHROID  Take 1 tablet (50 mcg total) by mouth daily.   liothyronine  5 MCG tablet Commonly known as: CYTOMEL  Take 3 tablets (15 mcg total) by mouth daily.   progesterone 100 MG capsule Commonly known as: PROMETRIUM Take 100 mg by mouth daily.   Testosterone 37.5 MG Pllt Inject 37.5 mg as directed See admin instructions. What changed: Another medication with the same name was removed. Continue taking this medication, and follow the directions you see here. Changed by: Charlies Bellini, DO   tirzepatide  7.5 MG/0.5ML injection vial Inject 7.5 mg into the skin once a week.        All past medical history, surgical history, allergies, family history, immunizations andmedications were updated in the EMR today and reviewed under the  history and medication portions of their EMR.     Review of Systems  Constitutional:  Positive for malaise/fatigue. Negative for chills, diaphoresis and fever.  Respiratory:  Negative for cough, shortness of breath and wheezing.  Cardiovascular:  Positive for chest pain. Negative for palpitations and leg swelling.  Gastrointestinal:  Negative for abdominal pain, heartburn, nausea and vomiting.  Skin:  Negative for rash.  Neurological:  Negative for dizziness, weakness and headaches.   Negative, with the exception of above mentioned in HPI   Objective:  BP 124/78   Pulse 67   Temp 98.2 F (36.8 C)   Wt 240 lb (108.9 kg)   SpO2 98%   BMI 36.49 kg/m  Body mass index is 36.49 kg/m. Physical Exam Vitals and nursing note reviewed.  Constitutional:      General: She is not in acute distress.    Appearance: Normal appearance. She is normal weight. She is not ill-appearing, toxic-appearing or diaphoretic.  HENT:     Head: Normocephalic and atraumatic.  Eyes:     General: No scleral icterus.       Right eye: No discharge.        Left eye: No discharge.     Extraocular Movements: Extraocular movements intact.     Conjunctiva/sclera: Conjunctivae normal.     Pupils: Pupils are equal, round, and reactive to light.  Cardiovascular:     Rate and Rhythm: Normal rate and regular rhythm.     Heart sounds: Murmur (1/6 SM RUSB) heard.  Pulmonary:     Effort: Pulmonary effort is normal. No respiratory distress.     Breath sounds: Normal breath sounds. No wheezing, rhonchi or rales.  Chest:     Chest wall: No mass, swelling or tenderness.       Comments: Lx of discomfort- not reproducible to severity on exam, +mild discomfort Abdominal:     General: Abdomen is flat.     Palpations: Abdomen is soft.     Tenderness: There is no abdominal tenderness. There is no guarding or rebound. Negative signs include Murphy's sign.  Musculoskeletal:     Cervical back: Neck supple.     Right lower  leg: No edema.     Left lower leg: No edema.  Skin:    General: Skin is warm.     Findings: No rash.  Neurological:     Mental Status: She is alert and oriented to person, place, and time. Mental status is at baseline.     Motor: No weakness.     Coordination: Coordination normal.     Gait: Gait normal.  Psychiatric:        Mood and Affect: Mood normal.        Behavior: Behavior normal.        Thought Content: Thought content normal.        Judgment: Judgment normal.    No results found. No results found. No results found for this or any previous visit (from the past 24 hours).  Assessment/Plan: Merve L Combrinck-Graham is a 54 y.o. female present for OV for  Chest discomfort (Primary)/Chest pain Atypical chest pain right sided x 3 over the last 5 days.  Lasting less than 10 minutes, self resolving after rest. DDx: Costochondritis, infiltrate, PE, cardiac Patient had very mild discomfort with palpation, but not able to reproduce the severity of her symptoms reported.  Cannot rule out lung/cardiac etiology without imaging studies and labs.  Most likely etiology is costochondritis. - DG Chest 2 View; Future - D-Dimer, Quantitative - CBC w/Diff - Comp Met (CMET) - Lipase  Anxiousness: Increased Cytomel  dose may be causing her some mild anxiousness.  Encouraged her to discuss with her endocrinologist, she may want to  consider lowering her Cytomel  dose since she is having increased anxiety. If anxiousness worsens, could consider start of SSRI/SNRI to help with symptoms.  Hormone replacement therapy (HRT) - Patient is on hormone therapy, therefore will need to complete D-dimer in order to rule out PE.  She has no other symptoms of PE and vitals are stable.   D-Dimer, Quantitative If D-dimer positive will move forward with CTA  Murmur, cardiac-chest pain-fatigue/family history of heart disease Appreciated a very mild systolic murmur right upper sternal border today.  Will move  forward with echocardiogram since this is an the approximate location of her symptoms and she has had chronic fatigue.   Reviewed expectations re: course of current medical issues. Discussed self-management of symptoms. Outlined signs and symptoms indicating need for more acute intervention. Patient verbalized understanding and all questions were answered. Patient received an After-Visit Summary.    Orders Placed This Encounter  Procedures   DG Chest 2 View   D-Dimer, Quantitative   CBC w/Diff   Comp Met (CMET)   Lipase   ECHOCARDIOGRAM COMPLETE   No orders of the defined types were placed in this encounter.  Referral Orders  No referral(s) requested today   I personally spent a total of 52 minutes in the care of the patient today including preparing to see the patient, getting/reviewing separately obtained history, performing a medically appropriate exam/evaluation, placing orders, referring and communicating with other health care professionals, documenting clinical information in the EHR, and coordinating care.   Note is dictated utilizing voice recognition software. Although note has been proof read prior to signing, occasional typographical errors still can be missed. If any questions arise, please do not hesitate to call for verification.   electronically signed by:  Charlies Bellini, DO  [facility] [place]       [1] No Known Allergies  "

## 2024-11-23 NOTE — Patient Instructions (Signed)
 Return if symptoms worsen or fail to improve.   We will call you with results and further plan discussed at that time     Great to see you today.  I have refilled the medication(s) we provide.   If labs were collected or images ordered, we will inform you of  results once we have received them and reviewed. We will contact you either by echart message, or telephone call.  Please give ample time to the testing facility, and our office to run,  receive and review results. Please do not call inquiring of results, even if you can see them in your chart. We will contact you as soon as we are able. If it has been over 1 week since the test was completed, and you have not yet heard from us , then please call us .    - echart message- for normal results that have been seen by the patient already.   - telephone call: abnormal results or if patient has not viewed results in their echart.  If a referral to a specialist was entered for you, please call us  in 2 weeks if you have not heard from the specialist office to schedule.

## 2024-11-24 LAB — D-DIMER, QUANTITATIVE: D-Dimer, Quant: 0.37 ug{FEU}/mL

## 2024-11-25 NOTE — Telephone Encounter (Signed)
 Communication  Reason for CRM: Patient calling about lab results and results for a T3 ,please return call concerning this.   Sent MyChart.

## 2024-11-30 ENCOUNTER — Other Ambulatory Visit (INDEPENDENT_AMBULATORY_CARE_PROVIDER_SITE_OTHER)

## 2024-11-30 DIAGNOSIS — Z8249 Family history of ischemic heart disease and other diseases of the circulatory system: Secondary | ICD-10-CM | POA: Diagnosis not present

## 2024-11-30 DIAGNOSIS — R5383 Other fatigue: Secondary | ICD-10-CM

## 2024-11-30 DIAGNOSIS — R011 Cardiac murmur, unspecified: Secondary | ICD-10-CM

## 2024-11-30 DIAGNOSIS — R079 Chest pain, unspecified: Secondary | ICD-10-CM

## 2024-11-30 LAB — ECHOCARDIOGRAM COMPLETE
AR max vel: 1.7 cm2
AV Area VTI: 1.57 cm2
AV Area mean vel: 1.67 cm2
AV Mean grad: 7 mmHg
AV Peak grad: 13.1 mmHg
Ao pk vel: 1.81 m/s
Area-P 1/2: 3.99 cm2
Calc EF: 61.6 %
S' Lateral: 2.9 cm
Single Plane A2C EF: 61.5 %
Single Plane A4C EF: 63 %

## 2024-12-02 NOTE — Telephone Encounter (Signed)
 No further action needed.

## 2025-01-02 ENCOUNTER — Ambulatory Visit: Admitting: Nurse Practitioner
